# Patient Record
Sex: Female | Born: 2000 | Race: Black or African American | Hispanic: No | Marital: Single | Smoking: Never smoker
Health system: Southern US, Community
[De-identification: ages and names within clinical notes are randomized; demographics above are authoritative.]

## PROBLEM LIST (undated history)

## (undated) DIAGNOSIS — L309 Dermatitis, unspecified: Secondary | ICD-10-CM

## (undated) DIAGNOSIS — A749 Chlamydial infection, unspecified: Secondary | ICD-10-CM

## (undated) DIAGNOSIS — D649 Anemia, unspecified: Secondary | ICD-10-CM

## (undated) DIAGNOSIS — J45909 Unspecified asthma, uncomplicated: Secondary | ICD-10-CM

## (undated) HISTORY — PX: NO PAST SURGERIES: SHX2092

---

## 2019-01-11 NOTE — L&D Delivery Note (Addendum)
Patient is 19 y.o. G2P0010 [redacted]w[redacted]d admitted for IOL for gHTN.   Delivery Note At 5:24 PM a viable female was delivered via Vaginal, Spontaneous (Presentation: Left Occiput Anterior).  APGAR: 8, 9; weight 3694g. Placenta status: Spontaneous, Intact.  Cord: 3 vessel.  Anesthesia: Local lidocaine 20 cc Episiotomy:  none Lacerations: 1st degree;Perineal, repaired as noted below. Bilateral labial, hemostatic without repair. Suture Repair: 2.0 vicryl Est. Blood Loss (mL):  300 mL  Mom to postpartum.  Baby to Couplet care / Skin to Skin.  Upon arrival patient was complete and pushing. She pushed with good maternal effort to deliver a healthy baby boy. Baby delivered without difficulty, was noted to have good tone and place on maternal abdomen for oral suctioning, drying and stimulation. Delayed cord clamping performed. Placenta delivered intact with 3V cord. Vaginal canal and perineum was inspected and found to have a first degree perineal laceration which was repaired with 2.0 vicryl. Bilateral labial lacerations were also present but hemostatic and did not require sutures. Pitocin was started and uterus massaged until bleeding slowed. Counts of sharps, instruments, and lap pads were all correct.   [name], MD PGY-3 8/24/20215:55 PM   GME ATTESTATION:  I saw and evaluated the patient. I agree with the findings and the plan of care as documented in the resident's note.  Alric Seton, MD OB Fellow, [facility], [facility] 09/03/2019 6:43 PM

## 2019-01-16 ENCOUNTER — Inpatient Hospital Stay (HOSPITAL_COMMUNITY): Payer: Medicaid Other

## 2019-01-16 ENCOUNTER — Encounter (HOSPITAL_COMMUNITY): Payer: Self-pay | Admitting: Family Medicine

## 2019-01-16 ENCOUNTER — Inpatient Hospital Stay (HOSPITAL_COMMUNITY)
Admission: AD | Admit: 2019-01-16 | Discharge: 2019-01-16 | Disposition: A | Discharge status: Home / Self Care | Payer: Medicaid Other | Attending: Family Medicine | Admitting: Family Medicine

## 2019-01-16 ENCOUNTER — Other Ambulatory Visit: Payer: Self-pay

## 2019-01-16 DIAGNOSIS — Z833 Family history of diabetes mellitus: Secondary | ICD-10-CM | POA: Insufficient documentation

## 2019-01-16 DIAGNOSIS — J45909 Unspecified asthma, uncomplicated: Secondary | ICD-10-CM | POA: Diagnosis not present

## 2019-01-16 DIAGNOSIS — R109 Unspecified abdominal pain: Secondary | ICD-10-CM

## 2019-01-16 DIAGNOSIS — O99511 Diseases of the respiratory system complicating pregnancy, first trimester: Secondary | ICD-10-CM | POA: Insufficient documentation

## 2019-01-16 DIAGNOSIS — O219 Vomiting of pregnancy, unspecified: Secondary | ICD-10-CM | POA: Diagnosis not present

## 2019-01-16 DIAGNOSIS — O3680X0 Pregnancy with inconclusive fetal viability, not applicable or unspecified: Secondary | ICD-10-CM

## 2019-01-16 DIAGNOSIS — O26891 Other specified pregnancy related conditions, first trimester: Secondary | ICD-10-CM

## 2019-01-16 DIAGNOSIS — O99891 Other specified diseases and conditions complicating pregnancy: Secondary | ICD-10-CM | POA: Insufficient documentation

## 2019-01-16 DIAGNOSIS — Z3A01 Less than 8 weeks gestation of pregnancy: Secondary | ICD-10-CM | POA: Insufficient documentation

## 2019-01-16 HISTORY — DX: Anemia, unspecified: D64.9

## 2019-01-16 HISTORY — DX: Dermatitis, unspecified: L30.9

## 2019-01-16 HISTORY — DX: Chlamydial infection, unspecified: A74.9

## 2019-01-16 HISTORY — DX: Unspecified asthma, uncomplicated: J45.909

## 2019-01-16 LAB — URINALYSIS, ROUTINE W REFLEX MICROSCOPIC
Bilirubin Urine: NEGATIVE
Glucose, UA: NEGATIVE mg/dL
Hgb urine dipstick: NEGATIVE
Ketones, ur: 20 mg/dL — AB
Leukocytes,Ua: NEGATIVE
Nitrite: NEGATIVE
Protein, ur: NEGATIVE mg/dL
Specific Gravity, Urine: 1.012 (ref 1.005–1.030)
pH: 8 (ref 5.0–8.0)

## 2019-01-16 LAB — TYPE AND SCREEN
ABO/RH(D): O POS
Antibody Screen: NEGATIVE

## 2019-01-16 LAB — CBC
HCT: 38 % (ref 36.0–46.0)
Hemoglobin: 12 g/dL (ref 12.0–15.0)
MCH: 24.1 pg — ABNORMAL LOW (ref 26.0–34.0)
MCHC: 31.6 g/dL (ref 30.0–36.0)
MCV: 76.5 fL — ABNORMAL LOW (ref 80.0–100.0)
Platelets: 217 10*3/uL (ref 150–400)
RBC: 4.97 MIL/uL (ref 3.87–5.11)
RDW: 14.8 % (ref 11.5–15.5)
WBC: 11 10*3/uL — ABNORMAL HIGH (ref 4.0–10.5)
nRBC: 0 % (ref 0.0–0.2)

## 2019-01-16 LAB — HCG, QUANTITATIVE, PREGNANCY: hCG, Beta Chain, Quant, S: 170584 m[IU]/mL — ABNORMAL HIGH (ref <5)

## 2019-01-16 LAB — POCT PREGNANCY, URINE: Preg Test, Ur: POSITIVE — AB

## 2019-01-16 LAB — ABO/RH: ABO/RH(D): O POS

## 2019-01-16 MED ORDER — PROMETHAZINE HCL 12.5 MG PO TABS: 12.5000 mg | ORAL_TABLET | Freq: Four times a day (QID) | ORAL | 0 refills | Status: DC | PRN

## 2019-01-16 MED ORDER — ONDANSETRON 4 MG PO TBDP
4.0000 mg | ORAL_TABLET | Freq: Once | ORAL | Status: AC
  Administered 2019-01-16: 4 mg via ORAL
  Filled 2019-01-16: qty 1

## 2019-01-16 NOTE — Discharge Instructions (Signed)
Morning Sickness ° °Morning sickness is when you feel sick to your stomach (nauseous) during pregnancy. You may feel sick to your stomach and throw up (vomit). You may feel sick in the morning, but you can feel this way at any time of day. Some women feel very sick to their stomach and cannot stop throwing up (hyperemesis gravidarum). °Follow these instructions at home: °Medicines °· Take over-the-counter and prescription medicines only as told by your doctor. Do not take any medicines until you talk with your doctor about them first. °· Taking multivitamins before getting pregnant can stop or lessen the harshness of morning sickness. °Eating and drinking °· Eat dry toast or crackers before getting out of bed. °· Eat 5 or 6 small meals a day. °· Eat dry and bland foods like rice and baked potatoes. °· Do not eat greasy, fatty, or spicy foods. °· Have someone cook for you if the smell of food causes you to feel sick or throw up. °· If you feel sick to your stomach after taking prenatal vitamins, take them at night or with a snack. °· Eat protein when you need a snack. Nuts, yogurt, and cheese are good choices. °· Drink fluids throughout the day. °· Try ginger ale made with real ginger, ginger tea made from fresh grated ginger, or ginger candies. °General instructions °· Do not use any products that have nicotine or tobacco in them, such as cigarettes and e-cigarettes. If you need help quitting, ask your doctor. °· Use an air purifier to keep the air in your house free of smells. °· Get lots of fresh air. °· Try to avoid smells that make you feel sick. °· Try: °? Wearing a bracelet that is used for seasickness (acupressure wristband). °? Going to a doctor who puts thin needles into certain body points (acupuncture) to improve how you feel. °Contact a doctor if: °· You need medicine to feel better. °· You feel dizzy or light-headed. °· You are losing weight. °Get help right away if: °· You feel very sick to your  stomach and cannot stop throwing up. °· You pass out (faint). °· You have very bad pain in your belly. °Summary °· Morning sickness is when you feel sick to your stomach (nauseous) during pregnancy. °· You may feel sick in the morning, but you can feel this way at any time of day. °· Making some changes to what you eat may help your symptoms go away. °This information is not intended to replace advice given to you by your health care provider. Make sure you discuss any questions you have with your health care provider. °Document Revised: 12/09/2016 Document Reviewed: 01/28/2016 °Elsevier Patient Education © 2020 Elsevier Inc. ° °

## 2019-01-16 NOTE — MAU Note (Signed)
Keeps getting nauseous during the night.  Since last night has not been able to keep anything down. +HPT, has not been confirmed.  Pain in upper abd, started yesterday.

## 2019-01-16 NOTE — MAU Provider Note (Addendum)
History     CSN: [id number]  Arrival date and time: 01/16/19 1402   First Provider Initiated Contact with Patient 01/16/19 1550      Chief Complaint  Patient presents with   Abdominal Pain   Emesis   Nausea   Possible Pregnancy   HPI Karen Joseph is a 19 yo G2P0010 at [facility] presenting for increased nausea and abdominal pain. Patient states she started having intense nausea with 3 bouts of emesis starting yesterday (1/6). She has not been able to keep any food or liquid down since 8pm last night and had difficulty sleeping due to nausea. She has had diffuse upper abdominal pain for the past two weeks. She has not been to a prenatal appointment, and is waiting for her medicaid information to be updated before scheduling one.   OB History     Gravida  2   Para      Term      Preterm      AB  1   Living  0      SAB      TAB      Ectopic      Multiple      Live Births  0           Past Medical History:  Diagnosis Date   Anemia    Asthma    Chlamydia    Eczema     Past Surgical History:  Procedure Laterality Date   NO PAST SURGERIES      Family History  Problem Relation Age of Onset   Hypertension Mother    Diabetes Mother    Heart disease Mother    ALS Father    Asthma Father     Social History   Tobacco Use   Smoking status: Never Smoker   Smokeless tobacco: Never Used  Substance Use Topics   Alcohol use: Not Currently    Comment: occ   Drug use: Never    Allergies: No Known Allergies  Medications Prior to Admission  Medication Sig Dispense Refill Last Dose   acetaminophen (TYLENOL) 325 MG tablet Take 325 mg by mouth every 6 (six) hours as needed for mild pain.   Past Month at Unknown time   ALBUTEROL IN Inhale into the lungs.   More than a month at Unknown time    Review of Systems  Constitutional: Negative for fatigue and fever.  Respiratory: Negative for cough and shortness of breath.   Cardiovascular: Negative for chest  pain and leg swelling.  Gastrointestinal: Positive for abdominal pain, nausea and vomiting.  Genitourinary: Negative for dysuria and vaginal discharge.  Neurological: Negative for light-headedness.   Physical Exam   Blood pressure 124/77, pulse 78, temperature 98.7 F (37.1 C), temperature source Oral, resp. rate 16, height 5' (1.524 m), weight 52.5 kg, last menstrual period 11/24/2018, SpO2 100 %.  Physical Exam  Constitutional: She is oriented to person, place, and time. She appears well-developed and well-nourished.  HENT:  Head: Normocephalic and atraumatic.  Dry mucous membranes   Cardiovascular: Normal rate and regular rhythm.  Respiratory: Effort normal.  GI: Soft. There is abdominal tenderness in the right upper quadrant, right lower quadrant and left upper quadrant.  Neurological: She is alert and oriented to person, place, and time.  Skin: Skin is warm and dry.    MAU Course  Procedures  MDM -[place] workup started given abdominal pain in first trimester.        CBC, ABO, hCG  quant - (patient d/c before resulting)       TVUS - normal intrauterine pregnancy        UA - normal  -Sublingual Zofran given (patient made aware of controversy regarding use in first trimester)   Assessment and Plan  Karen Joseph is a 19 yo G2P0010 at [facility] presenting for increased nausea and abdominal pain. Nausea is likely normal for first trimester pregnancy, however, required ectopic pregnancy rule out for abdominal tenderness to palpation. Patient's symptoms improved with sublingual Zofran. Ultrasound revealed normal intrauterine pregnancy, therefore patient was discharged with prescription for phenergan.   Nausea  Vomiting  -Phenergan 12.5 mg tablet, take when nauseas  -Return precautions   Normal first trimester pregnancy -Encouraged patient to make first prenatal appointment  -Counseled on marijuana during pregnancy   Lauren R Waskowicz 01/16/2019, 4:32 PM    OB FELLOW  ATTESTATION  I have seen and examined this patient, repeated all portions of the history and physical exam, and agree with above documentation except as noted below.   19 y/o G2P0 who presented with n/v and abdominal pain, mild tenderness to palpation in LLQ and RLQ. Workup notable for living IUP on TVUS ruling out ectopic. Excellent response to ODT Zofran (after discussion of controversy surrounding first trimester use) and appeared well on exam without significant signs of dehydration, able to take PO liquids and food. Strong odor of MJ in room, cautioned against using this for nausea symptoms, given rx for phenergan.   Zack Seal, MD/MPH OB Fellow  01/16/2019, 7:20 PM

## 2019-04-08 ENCOUNTER — Ambulatory Visit (INDEPENDENT_AMBULATORY_CARE_PROVIDER_SITE_OTHER): Payer: Medicaid Other | Admitting: *Deleted

## 2019-04-08 ENCOUNTER — Encounter: Payer: Self-pay | Admitting: General Practice

## 2019-04-08 DIAGNOSIS — Z3480 Encounter for supervision of other normal pregnancy, unspecified trimester: Secondary | ICD-10-CM | POA: Insufficient documentation

## 2019-04-08 MED ORDER — BLOOD PRESSURE MONITOR AUTOMAT DEVI: 1.0000 | Freq: Every day | 0 refills | Status: DC

## 2019-04-08 NOTE — Progress Notes (Signed)
  Virtual Visit via Telephone Note  I connected with [facility] on 04/08/19 at  2:10 PM EDT by telephone and verified that I am speaking with the correct person using two identifiers.  Location: Patient: Karen Joseph MRN: [id number] Provider: Clovis Pu, RN   I discussed the limitations, risks, security and privacy concerns of performing an evaluation and management service by telephone and the availability of in person appointments. I also discussed with the patient that there may be a patient responsible charge related to this service. The patient expressed understanding and agreed to proceed.   History of Present Illness: PRENATAL INTAKE SUMMARY  Karen Joseph presents today New OB Nurse Interview.  OB History    Gravida  2   Para      Term      Preterm      AB  1   Living  0     SAB      TAB      Ectopic      Multiple      Live Births  0          I have reviewed the patient's medical, obstetrical, social, and family histories, medications, and available lab results.  SUBJECTIVE She has no unusual complaints   Observations/Objective: Initial nurse interview for history/labs (New OB)  EDD: 08/31/19 by LMP GA: [redacted]w[redacted]d G2P0010 FHT: non face to face interview  GENERAL APPEARANCE: non face to face interview  Assessment and Plan: Normal pregnancy Prenatal care-[facility] to be completed at next visit with Karen Joseph, CNM 04/25/19 Continue PNV Patient to sign up for Babyscripts Rx for blood pressure monitor sent to [facility] Has weight scale at home  Follow Up Instructions:   I discussed the assessment and treatment plan with the patient. The patient was provided an opportunity to ask questions and all were answered. The patient agreed with the plan and demonstrated an understanding of the instructions.   The patient was advised to call back or seek an in-person evaluation if the symptoms worsen or if the condition fails to  improve as anticipated.  I provided 15 minutes of non-face-to-face time during this encounter.   Clovis Pu, RN

## 2019-04-25 ENCOUNTER — Encounter: Payer: Self-pay | Admitting: General Practice

## 2019-04-25 ENCOUNTER — Other Ambulatory Visit (HOSPITAL_COMMUNITY)
Admission: RE | Admit: 2019-04-25 | Discharge: 2019-04-25 | Disposition: A | Discharge status: Home / Self Care | Payer: Medicaid Other | Source: Ambulatory Visit | Attending: Obstetrics and Gynecology | Admitting: Obstetrics and Gynecology

## 2019-04-25 ENCOUNTER — Other Ambulatory Visit: Payer: Self-pay

## 2019-04-25 ENCOUNTER — Other Ambulatory Visit: Payer: Self-pay | Admitting: *Deleted

## 2019-04-25 ENCOUNTER — Ambulatory Visit (INDEPENDENT_AMBULATORY_CARE_PROVIDER_SITE_OTHER): Payer: Medicaid Other | Admitting: Obstetrics and Gynecology

## 2019-04-25 VITALS — BP 108/69 | HR 101 | Temp 98.1°F | Wt 130.8 lb

## 2019-04-25 DIAGNOSIS — Z8709 Personal history of other diseases of the respiratory system: Secondary | ICD-10-CM

## 2019-04-25 DIAGNOSIS — Z3A21 21 weeks gestation of pregnancy: Secondary | ICD-10-CM

## 2019-04-25 DIAGNOSIS — Z3480 Encounter for supervision of other normal pregnancy, unspecified trimester: Secondary | ICD-10-CM | POA: Diagnosis not present

## 2019-04-25 DIAGNOSIS — O26892 Other specified pregnancy related conditions, second trimester: Secondary | ICD-10-CM

## 2019-04-25 DIAGNOSIS — O26899 Other specified pregnancy related conditions, unspecified trimester: Secondary | ICD-10-CM

## 2019-04-25 DIAGNOSIS — G5600 Carpal tunnel syndrome, unspecified upper limb: Secondary | ICD-10-CM

## 2019-04-25 MED ORDER — ALBUTEROL SULFATE HFA 108 (90 BASE) MCG/ACT IN AERS: 2.0000 | INHALATION_SPRAY | Freq: Four times a day (QID) | RESPIRATORY_TRACT | 2 refills | Status: AC | PRN

## 2019-04-25 MED ORDER — WRIST SPLINT/RIGHT MEDIUM MISC: 1.0000 | Freq: Every day | 0 refills | Status: DC

## 2019-04-25 NOTE — Progress Notes (Addendum)
INITIAL OBSTETRICAL VISIT Patient name: Karen Joseph MRN [id number]  Date of birth: [birth year] Chief Complaint:   Initial Prenatal Visit  History of Present Illness:   Karen Joseph is a 19 y.o. G69P0010 African American female at [redacted]w[redacted]d by LMP with an Estimated Date of Delivery: 08/31/19 being seen today for her initial obstetrical visit.  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 This is an unplanned pregnancy. She and the father of the baby (FOB) "Traviante" live together. She has a support system that consists of the FOB, her mother/father/friends. Today she reports carpal tunnel symptoms and white vaginal discharge. 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 Sh reports she stopped smoking THC the first week in January.  Patient's last menstrual period was 11/24/2018. Last pap n/a. Results were: n/a Review of Systems:   Pertinent items are noted in HPI Denies cramping/contractions, leakage of fluid, vaginal bleeding, abnormal vaginal discharge w/ itching/odor/irritation, headaches, visual changes, shortness of breath, chest pain, abdominal pain, severe nausea/vomiting, or problems with urination or bowel movements unless otherwise stated above.  Pertinent History Reviewed:  Reviewed past medical,surgical, social, obstetrical and family history.  Reviewed problem list, medications and allergies. OB History  Gravida Para Term Preterm AB Living  2       1 0  SAB TAB Ectopic Multiple Live Births          0    # Outcome Date GA Lbr Len/2nd Weight Sex Delivery Anes PTL Lv  2 Current           1 AB            Physical Assessment:   Vitals:   04/25/19 1455  BP: 108/69  Pulse: (!) 101  Temp: 98.1 F (36.7 C)  Weight: 130 lb 12.8 oz (59.3 kg)  Body mass index is 25.55 kg/m.       Physical Examination:  General appearance - well appearing, and in no distress  Mental status - alert, oriented to person, place, and time  Psych:  She has a normal mood and  affect  Skin - warm and dry, normal color, no suspicious lesions noted  Chest - effort normal, all lung fields clear to auscultation bilaterally  Heart - normal rate and regular rhythm  Abdomen - soft, nontender  Extremities:  No swelling or varicosities noted  Pelvic - VULVA: normal appearing vulva with no masses, tenderness or lesions  VAGINA: normal appearing vagina with normal color and discharge, no lesions.   CERVIX: normal appearing cervix without discharge or lesions, no CMT  Thin prep pap is done without HR HPV cotesting Low  No results found for this or any previous visit (from the past 24 hour(s)).  Assessment & Plan:  1) Low-Risk Pregnancy G2P0010 at [redacted]w[redacted]d with an Estimated Date of Delivery: 08/31/19   2) Initial OB visit - Welcomed to practice and introduced self to patient in addition to discussing other advanced practice providers that she may be seeing at this practice - Congratulated patient - Anticipatory guidance on upcoming appointments - Educated on COVID19 and pregnancy and the integration of virtual appointments  - Educated on babyscripts app- patient reports she has not received email, encouraged to look in spam folder and to call office if she still has not received email - patient verbalizes understanding   3) Supervision of other normal pregnancy, antepartum  - Obstetric Panel, Including HIV,  - Hepatitis C Antibody,  - Culture, OB Urine,  - Cytology -  PAP( [place]),  - Cervicovaginal ancillary only( [place]),  - Genetic Screening,  - AFP TETRA,  - Hemoglobin A1c,  - Glucose, random - Information provided on safe meds in pg  - OB MFM 14+ wks complete U/S ordered for 18-20 wks  4) Carpal tunnel syndrome during pregnancy  - Rx for Right Wrist - Plan: Elastic Bandages & Supports (WRIST SPLINT/RIGHT MEDIUM) MISC - Information provided on carpal tunnel syndrome - Explained that with the frequent moving of her wrist with her job, it is not unusual to  have carpal tunnel syndrome in pregnancy     Meds:  Meds ordered this encounter  Medications  . Elastic Bandages & Supports (WRIST SPLINT/RIGHT MEDIUM) MISC    Sig: 1 each by Does not apply route daily.    Dispense:  1 each    Refill:  0    Order Specific Question:   Supervising Provider    Answer:   Donnamae Jude [[id number]]    Initial labs obtained Continue prenatal vitamins Reviewed n/v relief measures and warning s/s to report Reviewed recommended weight gain based on pre-gravid BMI Encouraged well-balanced diet Genetic Screening discussed: ordered Cystic fibrosis, SMA, Fragile X screening discussed ordered The nature of [place] with multiple MDs and other Advanced Practice Providers was explained to patient; also emphasized that residents, students are part of our team.  Discussed optimized OB schedule and video visits. Advised can have an in-office visit whenever she feels she needs to be seen.  Does own BP cuff. Explained to patient that BP will be mailed to her house. Check BP weekly, let us know if >140/90. Advised to call during normal business hours and there is an after-hours nurse line available.    Follow-up: Return in about 3 weeks (around 05/16/2019) for Return OB - My Chart video.   Orders Placed This Encounter  Procedures  . Culture, OB Urine  . Obstetric Panel, Including HIV  . Hepatitis C Antibody  . Genetic Screening  . AFP TETRA  . Hemoglobin A1c  . Glucose, random    [employer] MSN, [place] 04/25/2019

## 2019-04-25 NOTE — Patient Instructions (Addendum)
Carpal Tunnel Syndrome  Carpal tunnel syndrome is a condition that causes pain in your hand and arm. The carpal tunnel is a narrow area located on the palm side of your wrist. Repeated wrist motion or certain diseases may cause swelling within the tunnel. This swelling pinches the main nerve in the wrist (median nerve). What are the causes? This condition may be caused by:  Repeated wrist motions.  Wrist injuries.  Arthritis.  A cyst or tumor in the carpal tunnel.  Fluid buildup during pregnancy. Sometimes the cause of this condition is not known. What increases the risk? The following factors may make you more likely to develop this condition:  Having a job, such as being a butcher or a cashier, that requires you to repeatedly move your wrist in the same motion.  Being a woman.  Having certain conditions, such as: ? Diabetes. ? Obesity. ? An underactive thyroid (hypothyroidism). ? Kidney failure. What are the signs or symptoms? Symptoms of this condition include:  A tingling feeling in your fingers, especially in your thumb, index, and middle fingers.  Tingling or numbness in your hand.  An aching feeling in your entire arm, especially when your wrist and elbow are bent for a long time.  Wrist pain that goes up your arm to your shoulder.  Pain that goes down into your palm or fingers.  A weak feeling in your hands. You may have trouble grabbing and holding items. Your symptoms may feel worse during the night. How is this diagnosed? This condition is diagnosed with a medical history and physical exam. You may also have tests, including:  Electromyogram (EMG). This test measures electrical signals sent by your nerves into the muscles.  Nerve conduction study. This test measures how well electrical signals pass through your nerves.  Imaging tests, such as X-rays, ultrasound, and MRI. These tests check for possible causes of your condition. How is this treated? This  condition may be treated with:  Lifestyle changes. It is important to stop or change the activity that caused your condition.  Doing exercise and activities to strengthen your muscles and bones (physical therapy).  Learning how to use your hand again after diagnosis (occupational therapy).  Medicines for pain and inflammation. This may include medicine that is injected into your wrist.  A wrist splint.  Surgery. Follow these instructions at home: If you have a splint:  Wear the splint as told by your health care provider. Remove it only as told by your health care provider.  Loosen the splint if your fingers tingle, become numb, or turn cold and blue.  Keep the splint clean.  If the splint is not waterproof: ? Do not let it get wet. ? Cover it with a watertight covering when you take a bath or shower. Managing pain, stiffness, and swelling   If directed, put ice on the painful area: ? If you have a removable splint, remove it as told by your health care provider. ? Put ice in a plastic bag. ? Place a towel between your skin and the bag. ? Leave the ice on for 20 minutes, 2-3 times per day. General instructions  Take over-the-counter and prescription medicines only as told by your health care provider.  Rest your wrist from any activity that may be causing your pain. If your condition is work related, talk with your employer about changes that can be made, such as getting a wrist pad to use while typing.  Do any exercises as told   by your health care provider, physical therapist, or occupational therapist.  Keep all follow-up visits as told by your health care provider. This is important. Contact a health care provider if:  You have new symptoms.  Your pain is not controlled with medicines.  Your symptoms get worse. Get help right away if:  You have severe numbness or tingling in your wrist or hand. Summary  Carpal tunnel syndrome is a condition that causes pain in  your hand and arm.  It is usually caused by repeated wrist motions.  Lifestyle changes and medicines are used to treat carpal tunnel syndrome. Surgery may be recommended.  Follow your health care provider's instructions about wearing a splint, resting from activity, keeping follow-up visits, and calling for help. This information is not intended to replace advice given to you by your health care provider. Make sure you discuss any questions you have with your health care provider. Document Revised: 05/05/2017 Document Reviewed: 05/05/2017 Elsevier Patient Education  2020 [employer].  Safe Medications in Pregnancy   Acne: Benzoyl Peroxide Salicylic Acid  Backache/Headache: Tylenol: 2 regular strength every 4 hours OR              2 Extra strength every 6 hours  Colds/Coughs/Allergies: Benadryl (alcohol free) 25 mg every 6 hours as needed Breath right strips Claritin Cepacol throat lozenges Chloraseptic throat spray Cold-Eeze- up to three times per day Cough drops, alcohol free Flonase (by prescription only) Guaifenesin Mucinex Robitussin DM (plain only, alcohol free) Saline nasal spray/drops Sudafed (pseudoephedrine) & Actifed ** use only after [redacted] weeks gestation and if you do not have high blood pressure Tylenol Vicks Vaporub Zinc lozenges Zyrtec   Constipation: Colace Ducolax suppositories Fleet enema Glycerin suppositories Metamucil Milk of magnesia Miralax Senokot Smooth move tea  Diarrhea: Kaopectate Imodium A-D  *NO pepto Bismol  Hemorrhoids: Anusol Anusol HC Preparation H Tucks  Indigestion: Tums Maalox Mylanta Zantac  Pepcid  Insomnia: Benadryl (alcohol free) 25mg  every 6 hours as needed Tylenol PM Unisom, no Gelcaps  Leg Cramps: Tums MagGel  Nausea/Vomiting:  Bonine Dramamine Emetrol Ginger extract Sea bands Meclizine  Nausea medication to take during pregnancy:  Unisom (doxylamine succinate 25 mg tablets) Take one  tablet daily at bedtime. If symptoms are not adequately controlled, the dose can be increased to a maximum recommended dose of two tablets daily (1/2 tablet in the morning, 1/2 tablet mid-afternoon and one at bedtime). Vitamin B6 100mg  tablets. Take one tablet twice a day (up to 200 mg per day).  Skin Rashes: Aveeno products Benadryl cream or 25mg  every 6 hours as needed Calamine Lotion 1% cortisone cream  Yeast infection: Gyne-lotrimin 7 Monistat 7   **If taking multiple medications, please check labels to avoid duplicating the same active ingredients **take medication as directed on the label ** Do not exceed 4000 mg of tylenol in 24 hours **Do not take medications that contain aspirin or ibuprofen

## 2019-04-26 ENCOUNTER — Encounter: Payer: Self-pay | Admitting: General Practice

## 2019-04-26 LAB — CERVICOVAGINAL ANCILLARY ONLY
Bacterial Vaginitis (gardnerella): NEGATIVE
Candida Glabrata: NEGATIVE
Candida Vaginitis: POSITIVE — AB
Chlamydia: NEGATIVE
Comment: NEGATIVE
Comment: NEGATIVE
Comment: NEGATIVE
Comment: NEGATIVE
Comment: NEGATIVE
Comment: NORMAL
Neisseria Gonorrhea: NEGATIVE
Trichomonas: NEGATIVE

## 2019-04-26 LAB — HEPATITIS C ANTIBODY: Hep C Virus Ab: 0.1 s/co ratio (ref 0.0–0.9)

## 2019-04-26 LAB — GLUCOSE, RANDOM: Glucose: 84 mg/dL (ref 65–99)

## 2019-04-26 LAB — HEMOGLOBIN A1C
Est. average glucose Bld gHb Est-mCnc: 97 mg/dL
Hgb A1c MFr Bld: 5 % (ref 4.8–5.6)

## 2019-04-27 LAB — OBSTETRIC PANEL, INCLUDING HIV
Antibody Screen: NEGATIVE
Basophils Absolute: 0 10*3/uL (ref 0.0–0.2)
Basos: 0 %
EOS (ABSOLUTE): 0.4 10*3/uL (ref 0.0–0.4)
Eos: 3 %
HIV Screen 4th Generation wRfx: NONREACTIVE
Hematocrit: 29 % — ABNORMAL LOW (ref 34.0–46.6)
Hemoglobin: 9.1 g/dL — ABNORMAL LOW (ref 11.1–15.9)
Hepatitis B Surface Ag: NEGATIVE
Immature Grans (Abs): 0 10*3/uL (ref 0.0–0.1)
Immature Granulocytes: 0 %
Lymphocytes Absolute: 1.7 10*3/uL (ref 0.7–3.1)
Lymphs: 15 %
MCH: 24.7 pg — ABNORMAL LOW (ref 26.6–33.0)
MCHC: 31.4 g/dL — ABNORMAL LOW (ref 31.5–35.7)
MCV: 79 fL (ref 79–97)
Monocytes Absolute: 0.9 10*3/uL (ref 0.1–0.9)
Monocytes: 7 %
Neutrophils Absolute: 8.8 10*3/uL — ABNORMAL HIGH (ref 1.4–7.0)
Neutrophils: 75 %
Platelets: 253 10*3/uL (ref 150–450)
RBC: 3.68 x10E6/uL — ABNORMAL LOW (ref 3.77–5.28)
RDW: 13.9 % (ref 11.7–15.4)
RPR Ser Ql: NONREACTIVE
Rh Factor: POSITIVE
Rubella Antibodies, IGG: 1.5 index (ref >0.99)
WBC: 11.8 10*3/uL — ABNORMAL HIGH (ref 3.4–10.8)

## 2019-04-27 LAB — AFP TETRA
DIA Mom Value: 1.13
DIA Value (EIA): 259.61 pg/mL
DSR (By Age)    1 IN: 1137
DSR (Second Trimester) 1 IN: 10000
Gestational Age: 21 WEEKS
MSAFP Mom: 1.14
MSAFP: 91.9 ng/mL
MSHCG Mom: 0.42
MSHCG: 10765 m[IU]/mL
Maternal Age At EDD: 21.6 yr
Osb Risk: 10000
T18 (By Age): 1:4429 {titer}
Test Results:: NEGATIVE
Weight: 130 [lb_av]
uE3 Mom: 1.01
uE3 Value: 2.92 ng/mL

## 2019-04-27 LAB — URINE CULTURE, OB REFLEX

## 2019-04-27 LAB — CULTURE, OB URINE

## 2019-04-29 LAB — CYTOLOGY - PAP: Diagnosis: NEGATIVE

## 2019-05-06 ENCOUNTER — Encounter: Payer: Self-pay | Admitting: General Practice

## 2019-05-09 ENCOUNTER — Encounter: Payer: Self-pay | Admitting: General Practice

## 2019-05-16 ENCOUNTER — Telehealth (INDEPENDENT_AMBULATORY_CARE_PROVIDER_SITE_OTHER): Payer: Medicaid Other | Admitting: Obstetrics and Gynecology

## 2019-05-16 DIAGNOSIS — Z3A26 26 weeks gestation of pregnancy: Secondary | ICD-10-CM

## 2019-05-16 DIAGNOSIS — Z3480 Encounter for supervision of other normal pregnancy, unspecified trimester: Secondary | ICD-10-CM

## 2019-05-16 NOTE — Patient Instructions (Signed)
Considering Waterbirth? Guide for patients at [facility]  Why consider waterbirth?  . Gentle birth for babies . Less pain medicine used in labor . May allow for passive descent/less pushing . May reduce perineal tears  . More mobility and instinctive maternal position changes . Increased maternal relaxation . Reduced blood pressure in labor  Is waterbirth safe? What are the risks of infection, drowning or other complications?  . Infection: o Very low risk (3.7 % for tub vs 4.8% for bed) o 7 in 8000 waterbirths with documented infection o Poorly cleaned equipment most common cause o Slightly lower group B strep transmission rate  . Drowning o Maternal:  - Very low risk   - Related to seizures or fainting o Newborn:  - Very low risk. No evidence of increased risk of respiratory problems in multiple large studies - Physiological protection from breathing under water - Avoid underwater birth if there are any fetal complications - Once baby's head is out of the water, keep it out.  . Birth complication o Some reports of cord trauma, but risk decreased by bringing baby to surface gradually o No evidence of increased risk of shoulder dystocia. Mothers can usually change positions faster in water than in a bed, possibly aiding the maneuvers to free the shoulder.   You must attend a [facility]en Dolin class at [employer] by calling [phone] or online at [email or web address] for virtual class  Bring [place] the certificate from the class to your prenatal appointment  Meet with a midwife at 36 weeks to see if you can still plan a waterbirth and to sign the consent.   Things that would prevent you from having a waterbirth:  (+) COVID-19 infection  Premature, <37wks  Previous cesarean birth  Presence of thick meconium-stained fluid  Multiple gestation (Twins, triplets, etc.)  Uncontrolled diabetes or gestational diabetes  requiring medication  Hypertension requiring medication or diagnosis of pre-eclampsia  Heavy vaginal bleeding  Non-reassuring fetal heart rate  Active infection (MRSA, etc.). Group B Strep is NOT a contraindication for waterbirth.  If your labor has to be induced and induction method requires continuous monitoring of the baby's heart rate  Other risks/issues identified by your obstetrical provider  Please remember that birth is unpredictable. Under certain unforeseeable circumstances your provider may advise against giving birth in the tub. These decisions will be made on a case-by-case basis and with the safety of you and your baby as our highest priority.

## 2019-05-21 ENCOUNTER — Other Ambulatory Visit: Payer: Self-pay

## 2019-05-21 ENCOUNTER — Encounter: Payer: Self-pay | Admitting: General Practice

## 2019-05-21 ENCOUNTER — Ambulatory Visit (HOSPITAL_COMMUNITY): Payer: Medicaid Other | Attending: Obstetrics and Gynecology

## 2019-05-21 DIAGNOSIS — Z3A25 25 weeks gestation of pregnancy: Secondary | ICD-10-CM

## 2019-05-21 DIAGNOSIS — Z363 Encounter for antenatal screening for malformations: Secondary | ICD-10-CM | POA: Diagnosis not present

## 2019-05-21 DIAGNOSIS — Z148 Genetic carrier of other disease: Secondary | ICD-10-CM | POA: Diagnosis not present

## 2019-05-21 DIAGNOSIS — Z3480 Encounter for supervision of other normal pregnancy, unspecified trimester: Secondary | ICD-10-CM | POA: Insufficient documentation

## 2019-05-26 ENCOUNTER — Encounter: Payer: Self-pay | Admitting: Obstetrics and Gynecology

## 2019-05-26 NOTE — Progress Notes (Signed)
   MY CHART VIDEO VIRTUAL OBSTETRICS VISIT ENCOUNTER NOTE  I connected with Karen Joseph on 05/16/19 at  3:50 PM EDT by My Chart video at home and verified that I am speaking with the correct person using two identifiers.   I discussed the limitations, risks, security and privacy concerns of performing an evaluation and management service by My Chart video and the availability of in person appointments. I also discussed with the patient that there may be a patient responsible charge related to this service. The patient expressed understanding and agreed to proceed.  Subjective:  Karen Joseph is a 19 y.o. G2P0010 at [redacted]w[redacted]d being followed for ongoing prenatal care.  She is currently monitored for the following issues for this low-risk pregnancy and has Supervision of other normal pregnancy, antepartum on their problem list.  Patient reports no complaints. She expresses a desire to have a waterbirth, but not sure what it entails. Reports fetal movement. Denies any contractions, bleeding or leaking of fluid.   The following portions of the patient's history were reviewed and updated as appropriate: allergies, current medications, past family history, past medical history, past social history, past surgical history and problem list.   Objective:   General:  Alert, oriented and cooperative.   Mental Status: Normal mood and affect perceived. Normal judgment and thought content.  Rest of physical exam deferred due to type of encounter  BP 123/82   Pulse (!) 102   Wt 137 lb (62.1 kg)   LMP 11/24/2018   BMI 26.76 kg/m  **Done by patient's own at home BP cuff and scale  Assessment and Plan:  Pregnancy: G2P0010 at [redacted]w[redacted]d  1. Supervision of other normal pregnancy, antepartum - Anticipatory guidance given for upcoming 2 hr GTT  - Advised to have nothing to eat after midnight the night before her appr  Preterm labor symptoms and general obstetric precautions including but not limited to vaginal  bleeding, contractions, leaking of fluid and fetal movement were reviewed in detail with the patient.  I discussed the assessment and treatment plan with the patient. The patient was provided an opportunity to ask questions and all were answered. The patient agreed with the plan and demonstrated an understanding of the instructions. The patient was advised to call back or seek an in-person office evaluation/go to MAU at [facility] for any urgent or concerning symptoms. Please refer to After Visit Summary for other counseling recommendations.   I provided 5 minutes of non-face-to-face time during this encounter. There was 5 minutes of chart review time spent prior to this encounter. Total time spent = 10 minutes.  Return in about 4 weeks (around 06/13/2019) for Return OB 2hr GTT.  Future Appointments  Date Time Provider Department Center  06/12/2019  8:10 AM Burleson, Brand Males, NP [facility] None    Raelyn Mora, CNM [facility], [facility]

## 2019-06-12 ENCOUNTER — Encounter: Payer: Self-pay | Admitting: General Practice

## 2019-06-12 ENCOUNTER — Other Ambulatory Visit: Payer: Self-pay

## 2019-06-12 ENCOUNTER — Encounter: Payer: Self-pay | Admitting: Nurse Practitioner

## 2019-06-12 ENCOUNTER — Ambulatory Visit (INDEPENDENT_AMBULATORY_CARE_PROVIDER_SITE_OTHER): Payer: Medicaid Other | Admitting: Nurse Practitioner

## 2019-06-12 VITALS — BP 114/72 | HR 90 | Temp 97.5°F | Wt 140.2 lb

## 2019-06-12 DIAGNOSIS — Z3A28 28 weeks gestation of pregnancy: Secondary | ICD-10-CM

## 2019-06-12 DIAGNOSIS — Z3483 Encounter for supervision of other normal pregnancy, third trimester: Secondary | ICD-10-CM

## 2019-06-12 DIAGNOSIS — Z3480 Encounter for supervision of other normal pregnancy, unspecified trimester: Secondary | ICD-10-CM

## 2019-06-12 DIAGNOSIS — G5603 Carpal tunnel syndrome, bilateral upper limbs: Secondary | ICD-10-CM

## 2019-06-12 DIAGNOSIS — Z23 Encounter for immunization: Secondary | ICD-10-CM | POA: Diagnosis not present

## 2019-06-12 NOTE — Progress Notes (Signed)
    Subjective:  Karen Joseph is a 19 y.o. G2P0010 at [redacted]w[redacted]d being seen today for ongoing prenatal care.  She is currently monitored for the following issues for this low-risk pregnancy and has Supervision of other normal pregnancy, antepartum and Carpal tunnel syndrome on both sides on their problem list.  Patient reports having some tingling in both hands daily.  Has a wrist splint she is using at night on her right hand.  Offered to prescribe another splint, but not interested just now as it is not as severe in the left hand.  Contractions: Not present. Vag. Bleeding: None.  Movement: Present. Denies leaking of fluid.   The following portions of the patient's history were reviewed and updated as appropriate: allergies, current medications, past family history, past medical history, past social history, past surgical history and problem list. Problem list updated.  Objective:   Vitals:   06/12/19 0814  BP: 114/72  Pulse: 90  Temp: (!) 97.5 F (36.4 C)  Weight: 140 lb 3.2 oz (63.6 kg)    Fetal Status: Fetal Heart Rate (bpm): 141 Fundal Height: 31 cm Movement: Present     General:  Alert, oriented and cooperative. Patient is in no acute distress.  Skin: Skin is warm and dry. No rash noted.   Cardiovascular: Normal heart rate noted  Respiratory: Normal respiratory effort, no problems with respiration noted  Abdomen: Soft, gravid, appropriate for gestational age. Pain/Pressure: Absent     Pelvic:  Cervical exam deferred        Extremities: Normal range of motion.  Edema: None  Mental Status: Normal mood and affect. Normal behavior. Normal judgment and thought content.   Urinalysis:      Assessment and Plan:  Pregnancy: G2P0010 at [redacted]w[redacted]d  1. Supervision of other normal pregnancy, antepartum Reviewed putting BP into Babyscripts once a week. Reviewed signing up for childbirth and breastfeeding clsses. Reviewed selecting a pediatrician. 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 Has not yet had Covid vaccine - advised to notify office if she gets the Covid vaccine  - Glucose Tolerance, 2 Hours w/1 Hour - HIV Antibody (routine testing w rflx) - RPR - CBC - Tdap vaccine greater than or equal to 7yo IM  2. Need for tetanus, diphtheria, and acellular pertussis (Tdap) vaccine in patient of adolescent age or older  - Tdap vaccine greater than or equal to 7yo IM  Preterm labor symptoms and general obstetric precautions including but not limited to vaginal bleeding, contractions, leaking of fluid and fetal movement were reviewed in detail with the patient. Please refer to After Visit Summary for other counseling recommendations.  Return in about 2 weeks (around 06/26/2019) for virtual ROB.  Nolene Bernheim, RN, MSN, NP-BC Nurse Practitioner, [facility] [employer], [facility] 06/12/2019 8:59 AM

## 2019-06-13 ENCOUNTER — Encounter: Payer: Self-pay | Admitting: Nurse Practitioner

## 2019-06-13 ENCOUNTER — Other Ambulatory Visit: Payer: Self-pay | Admitting: *Deleted

## 2019-06-13 DIAGNOSIS — O99019 Anemia complicating pregnancy, unspecified trimester: Secondary | ICD-10-CM | POA: Insufficient documentation

## 2019-06-13 LAB — GLUCOSE TOLERANCE, 2 HOURS W/ 1HR
Glucose, 1 hour: 129 mg/dL (ref 65–179)
Glucose, 2 hour: 74 mg/dL (ref 65–152)
Glucose, Fasting: 88 mg/dL (ref 65–91)

## 2019-06-13 LAB — CBC
Hematocrit: 26.4 % — ABNORMAL LOW (ref 34.0–46.6)
Hemoglobin: 8 g/dL — ABNORMAL LOW (ref 11.1–15.9)
MCH: 23.2 pg — ABNORMAL LOW (ref 26.6–33.0)
MCHC: 30.3 g/dL — ABNORMAL LOW (ref 31.5–35.7)
MCV: 77 fL — ABNORMAL LOW (ref 79–97)
Platelets: 218 10*3/uL (ref 150–450)
RBC: 3.45 x10E6/uL — ABNORMAL LOW (ref 3.77–5.28)
RDW: 14.6 % (ref 11.7–15.4)
WBC: 9 10*3/uL (ref 3.4–10.8)

## 2019-06-13 LAB — HIV ANTIBODY (ROUTINE TESTING W REFLEX): HIV Screen 4th Generation wRfx: NONREACTIVE

## 2019-06-13 LAB — RPR: RPR Ser Ql: NONREACTIVE

## 2019-06-13 MED ORDER — ASCORBIC ACID 500 MG PO TABS: 500.0000 mg | ORAL_TABLET | ORAL | 2 refills | Status: AC

## 2019-06-13 MED ORDER — IRON 325 (65 FE) MG PO TABS: 1.0000 | ORAL_TABLET | ORAL | 2 refills | Status: AC

## 2019-06-20 ENCOUNTER — Other Ambulatory Visit: Payer: Self-pay

## 2019-06-20 ENCOUNTER — Ambulatory Visit (HOSPITAL_COMMUNITY)
Admission: RE | Admit: 2019-06-20 | Discharge: 2019-06-20 | Disposition: A | Discharge status: Home / Self Care | Payer: Medicaid Other | Source: Ambulatory Visit | Attending: Family Medicine | Admitting: Family Medicine

## 2019-06-20 DIAGNOSIS — D509 Iron deficiency anemia, unspecified: Secondary | ICD-10-CM | POA: Diagnosis present

## 2019-06-20 MED ORDER — SODIUM CHLORIDE 0.9 % IV SOLN
510.0000 mg | INTRAVENOUS | Status: DC
  Administered 2019-06-20: 510 mg via INTRAVENOUS
  Filled 2019-06-20: qty 17

## 2019-06-20 NOTE — Discharge Instructions (Signed)

## 2019-06-26 ENCOUNTER — Encounter: Payer: Self-pay | Admitting: Obstetrics and Gynecology

## 2019-06-26 ENCOUNTER — Telehealth (INDEPENDENT_AMBULATORY_CARE_PROVIDER_SITE_OTHER): Payer: Medicaid Other | Admitting: Obstetrics and Gynecology

## 2019-06-26 DIAGNOSIS — Z3480 Encounter for supervision of other normal pregnancy, unspecified trimester: Secondary | ICD-10-CM

## 2019-06-26 DIAGNOSIS — Z3483 Encounter for supervision of other normal pregnancy, third trimester: Secondary | ICD-10-CM | POA: Diagnosis not present

## 2019-06-26 NOTE — Progress Notes (Signed)
   MY CHART VIDEO VIRTUAL OBSTETRICS VISIT ENCOUNTER NOTE  I connected with Karen Joseph on 06/26/19 at 10:30 AM EDT by My Chart video at home and verified that I am speaking with the correct person using two identifiers. Provider located at [facility] [employer] at [place].   I discussed the limitations, risks, security and privacy concerns of performing an evaluation and management service by My Chart video and the availability of in person appointments. I also discussed with the patient that there may be a patient responsible charge related to this service. The patient expressed understanding and agreed to proceed.  Subjective:  Karen Joseph is a 19 y.o. G2P0010 at [redacted]w[redacted]d being followed for ongoing prenatal care.  She is currently monitored for the following issues for this low-risk pregnancy and has Supervision of other normal pregnancy, antepartum; Carpal tunnel syndrome on both sides; and Anemia in pregnancy, unspecified trimester on their problem list.  Patient reports feet swelling. Reports fetal movement. Denies any contractions, bleeding or leaking of fluid.   The following portions of the patient's history were reviewed and updated as appropriate: allergies, current medications, past family history, past medical history, past social history, past surgical history and problem list.   Objective:   General:  Alert, oriented and cooperative.   Mental Status: Normal mood and affect perceived. Normal judgment and thought content.  Rest of physical exam deferred due to type of encounter  BP 115/77   Pulse 97   Wt 140 lb (63.5 kg)   LMP 11/24/2018   BMI 27.34 kg/m  **Done by patient's own at home BP cuff and scale  Assessment and Plan:  Pregnancy: G2P0010 at [redacted]w[redacted]d  1. 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  I discussed the assessment and treatment plan with the patient. The patient was provided an opportunity to ask questions and all were answered. The patient agreed with the plan and demonstrated an understanding of the instructions. The patient was advised to call back or seek an in-person office evaluation/go to MAU at [facility] for any urgent or concerning symptoms. Please refer to After Visit Summary for other counseling recommendations.   I provided 5 minutes of non-face-to-face time during this encounter. There was 5 minutes of chart review time spent prior to this encounter. Total time spent = 10 minutes.  Return in about 4 weeks (around 07/24/2019) for Return OB - My Chart video.  Future Appointments  Date Time Provider Department Center  06/27/2019 10:00 AM [facility] ROOM 8 [facility] None    Raelyn Mora, CNM [facility], [facility]

## 2019-06-27 ENCOUNTER — Ambulatory Visit (HOSPITAL_COMMUNITY)
Admission: RE | Admit: 2019-06-27 | Discharge: 2019-06-27 | Disposition: A | Discharge status: Home / Self Care | Payer: Medicaid Other | Source: Ambulatory Visit | Attending: Family Medicine | Admitting: Family Medicine

## 2019-06-27 ENCOUNTER — Encounter (HOSPITAL_COMMUNITY): Payer: Medicaid Other

## 2019-06-27 ENCOUNTER — Other Ambulatory Visit: Payer: Self-pay

## 2019-06-27 DIAGNOSIS — D509 Iron deficiency anemia, unspecified: Secondary | ICD-10-CM | POA: Diagnosis present

## 2019-06-27 DIAGNOSIS — O99019 Anemia complicating pregnancy, unspecified trimester: Secondary | ICD-10-CM | POA: Diagnosis present

## 2019-06-27 MED ORDER — SODIUM CHLORIDE 0.9 % IV SOLN
510.0000 mg | INTRAVENOUS | Status: DC
  Administered 2019-06-27: 510 mg via INTRAVENOUS
  Filled 2019-06-27: qty 17

## 2019-07-19 NOTE — Progress Notes (Signed)
CBC with Hgb of 8.0 with MCV of 77 c/w iron deficiency anemia. For IV iron infusion.

## 2019-07-19 NOTE — Addendum Note (Signed)
Encounter addended by: Reva Bores, MD on: 07/19/2019 7:22 PM  Actions taken: Clinical Note Signed

## 2019-07-25 ENCOUNTER — Telehealth (INDEPENDENT_AMBULATORY_CARE_PROVIDER_SITE_OTHER): Payer: Medicaid Other | Admitting: Advanced Practice Midwife

## 2019-07-25 VITALS — BP 115/75 | HR 100 | Wt 145.0 lb

## 2019-07-25 DIAGNOSIS — R197 Diarrhea, unspecified: Secondary | ICD-10-CM

## 2019-07-25 DIAGNOSIS — O99891 Other specified diseases and conditions complicating pregnancy: Secondary | ICD-10-CM

## 2019-07-25 DIAGNOSIS — Z3480 Encounter for supervision of other normal pregnancy, unspecified trimester: Secondary | ICD-10-CM

## 2019-07-25 DIAGNOSIS — Z3A34 34 weeks gestation of pregnancy: Secondary | ICD-10-CM

## 2019-07-25 NOTE — Progress Notes (Signed)
° °  OBSTETRICS PRENATAL VIRTUAL VISIT ENCOUNTER NOTE  Provider location: [facility] [place]   I connected with [facility] on 07/25/19 at 10:50 AM EDT by MyChart Video Encounter at home and verified that I am speaking with the correct person using two identifiers.   I discussed the limitations, risks, security and privacy concerns of performing an evaluation and management service virtually and the availability of in person appointments. I also discussed with the patient that there may be a patient responsible charge related to this service. The patient expressed understanding and agreed to proceed. Subjective:  Karen Joseph is a 19 y.o. G2P0010 at [redacted]w[redacted]d being seen today for ongoing prenatal care.  She is currently monitored for the following issues for this low-risk pregnancy and has Supervision of other normal pregnancy, antepartum; Carpal tunnel syndrome on both sides; and Anemia in pregnancy, unspecified trimester on their problem list.  Patient reports diarrhea, now resolved.  Contractions: Not present. Vag. Bleeding: None.  Movement: Present. Denies any leaking of fluid.   The following portions of the patient's history were reviewed and updated as appropriate: allergies, current medications, past family history, past medical history, past social history, past surgical history and problem list.   Objective:   Vitals:   07/25/19 1050  BP: 115/75  Pulse: 100  Weight: 145 lb (65.8 kg)    Fetal Status:     Movement: Present     General:  Alert, oriented and cooperative. Patient is in no acute distress.  Respiratory: Normal respiratory effort, no problems with respiration noted  Mental Status: Normal mood and affect. Normal behavior. Normal judgment and thought content.  Rest of physical exam deferred due to type of encounter  Imaging: No results found.  Assessment and Plan:  Pregnancy: G2P0010 at [redacted]w[redacted]d 1. Supervision of other normal pregnancy,  antepartum - Routine care - Preemptive teaching in event of GBS + result, newborn sepsis concerns, impact on care  2. Diarrhea, unspecified type - Likely related to contaminated food - OTC Imodium safe in pregnancy, take as written if recurrence of diarrhea  Preterm labor symptoms and general obstetric precautions including but not limited to vaginal bleeding, contractions, leaking of fluid and fetal movement were reviewed in detail with the patient. I discussed the assessment and treatment plan with the patient. The patient was provided an opportunity to ask questions and all were answered. The patient agreed with the plan and demonstrated an understanding of the instructions. The patient was advised to call back or seek an in-person office evaluation/go to MAU at [facility] for any urgent or concerning symptoms. Please refer to After Visit Summary for other counseling recommendations.   I provided eight minutes of face-to-face time during this encounter.  Return in about 2 weeks (around 08/08/2019).  Future Appointments  Date Time Provider Department Center  08/07/2019  8:50 AM Judeth Horn, NP [facility] None    Calvert Cantor, CNM [facility], [facility]

## 2019-07-25 NOTE — Patient Instructions (Signed)

## 2019-08-07 ENCOUNTER — Other Ambulatory Visit: Payer: Self-pay

## 2019-08-07 ENCOUNTER — Ambulatory Visit (INDEPENDENT_AMBULATORY_CARE_PROVIDER_SITE_OTHER): Payer: Medicaid Other | Admitting: Student

## 2019-08-07 ENCOUNTER — Other Ambulatory Visit (HOSPITAL_COMMUNITY)
Admission: RE | Admit: 2019-08-07 | Discharge: 2019-08-07 | Disposition: A | Discharge status: Home / Self Care | Payer: Medicaid Other | Source: Ambulatory Visit | Attending: Student | Admitting: Student

## 2019-08-07 VITALS — BP 115/74 | HR 87 | Temp 97.7°F | Wt 153.4 lb

## 2019-08-07 DIAGNOSIS — Z3480 Encounter for supervision of other normal pregnancy, unspecified trimester: Secondary | ICD-10-CM

## 2019-08-07 DIAGNOSIS — Z3A36 36 weeks gestation of pregnancy: Secondary | ICD-10-CM

## 2019-08-07 DIAGNOSIS — O99013 Anemia complicating pregnancy, third trimester: Secondary | ICD-10-CM

## 2019-08-07 NOTE — Progress Notes (Signed)
   PRENATAL VISIT NOTE  Subjective:  Karen Joseph is a 19 y.o. G2P0010 at [redacted]w[redacted]d being seen today for ongoing prenatal care.  She is currently monitored for the following issues for this low-risk pregnancy and has Supervision of other normal pregnancy, antepartum; Carpal tunnel syndrome on both sides; and Anemia in pregnancy, unspecified trimester on their problem list.  Patient reports carpal tunnel symptoms.  Contractions: Not present. Vag. Bleeding: None.  Movement: Present. Denies leaking of fluid.   The following portions of the patient's history were reviewed and updated as appropriate: allergies, current medications, past family history, past medical history, past social history, past surgical history and problem list.   Objective:   Vitals:   08/07/19 0925  BP: 115/74  Pulse: 87  Temp: 97.7 F (36.5 C)  Weight: 153 lb 6.4 oz (69.6 kg)    Fetal Status: Fetal Heart Rate (bpm): 144 Fundal Height: 37 cm Movement: Present  Presentation: Vertex  General:  Alert, oriented and cooperative. Patient is in no acute distress.  Skin: Skin is warm and dry. No rash noted.   Cardiovascular: Normal heart rate noted  Respiratory: Normal respiratory effort, no problems with respiration noted  Abdomen: Soft, gravid, appropriate for gestational age.  Pain/Pressure: Absent     Pelvic: Cervical exam deferred        Extremities: Normal range of motion.  Edema: Other (Comment)  Mental Status: Normal mood and affect. Normal behavior. Normal judgment and thought content.   Assessment and Plan:  Pregnancy: G2P0010 at [redacted]w[redacted]d 1. Supervision of other normal pregnancy, antepartum -took waterbirth class, will bring certificate to next visit or upload to mychart - Culture, beta strep (group b only) - Cervicovaginal ancillary only( [place])  2. Anemia during pregnancy in third trimester -s/p feraheme infusion last month and currently taking iron supplements - CBC  Term labor symptoms and general  obstetric precautions including but not limited to vaginal bleeding, contractions, leaking of fluid and fetal movement were reviewed in detail with the patient. Please refer to After Visit Summary for other counseling recommendations.   Return in about 1 week (around 08/14/2019) for Routine OB.  Future Appointments  Date Time Provider Department Center  08/16/2019  9:10 AM Gerrit Heck, CNM [facility] None    Judeth Horn, NP

## 2019-08-07 NOTE — Patient Instructions (Signed)
Before Baby Comes Home Once your baby is home with you, things may become a bit hectic as you map out a schedule around your newborn's patterns. Preparing the things you need at home before that time comes is important. Before your baby arrives, make sure you:  Have all the supplies that you will need to care for your baby.  Know where to go if there is an emergency.  Discuss the baby's arrival with other family members. What supplies will I need? Having the following supplies ready before your baby arrives will help ensure that you are prepared: Large items  Crib or bassinet and mattress. Make sure to follow safe sleep recommendations to reduce the risk of sudden infant death syndrome.  Rear-facing infant car seat. Have a trained professional check to make sure that it is installed in your car correctly. Many hospitals and fire departments perform this service free of charge.  Stroller. Always make sure any products--including cribs, mattresses, bassinets, or portable cribs and play areas--are safe. Check for recalls on your specific brand and model of crib. Breastfeeding  Nursing pillow.  Milk storage containers or bags.  Nipple cream.  Nursing bra.  Breast pads.  Breast pump.  Breast shields. Feeding  Formula.  Purified bottled water.  6-8 bottles (4-5 oz bottles and 8-9 oz bottles).  6-8 bottle nipples.  Bibs and burp cloths.  Bottle brush.  Bottle sterilizer (or a pot with a lid). Bathing  Infant bath basin.  Mild baby soap and baby shampoo.  Soft cloth towel and washcloth.  Hooded towel. Diapering  Diapers. You may need to use as many as 10-12 diapers each day.  Baby wipes.  Diaper cream.  Petroleum jelly.  Changing pad.  Hand sanitizer. Health and safety  Rectal thermometer.  Infant medicines.  Bulb syringe.  Baby nail clippers.  Baby monitor.  2-3 pacifiers, if desired. Sleeping  Sleep sack or swaddling blanket.  Firm  mattress pad and fitted sheets for the crib or bassinet. Other supplies  Diaper bag.  Clothing, including one-piece outfits and pajamas.  Receiving blankets. Follow these instructions at home: Preparing for an emergency Prepare for an emergency by taking these steps:  Know when to seek care or call your health care provider.  Know how to get to the nearest hospital.  List the phone numbers of your baby's health care providers near your home phone and in your cell phone.  Take an infant first aid and CPR class.  Place the phone number for the poison control center on your refrigerator.  If there will be caregivers in the home, make sure your phone number, emergency contacts, and address are placed on the refrigerator in case they need to be given to emergency services. Preparing your family   Create a plan for visitors. Keep your baby away from people who have a cough, fever, or other symptoms of illness.  Prepare freezer meals ahead of time, and ask friends and family to help with meal preparation, errands, and everyday tasks.  If you have other children: ? Talk with them about the baby coming home. Ask them how they feel about it. ? Read a book together about being a new big brother or sister. ? Find ways to let them help you prepare for the new baby. ? Have someone ready to care for them while you are in the hospital. Where to find more information  Consumer Product Safety Commission: www.cpsc.gov  American Academy of Pediatrics: www.healthychildren.org  Safe Kids   Worldwide: www.safekids.org Summary  Planning is important before bringing your baby home from the hospital. You will need to have certain supplies ready before your baby arrives.  You will need to have a rear-facing infant car seat ready prior to bringing your baby home. Have a trained professional check to make sure that it is installed in your car correctly.  Always make sure any products--including  cribs, mattresses, bassinets, or portable cribs and play areas--are safe. Check for recalls on your specific brand and model of crib.  Know when to seek care or call your health care provider, and know how to get to the nearest hospital. This information is not intended to replace advice given to you by your health care provider. Make sure you discuss any questions you have with your health care provider. Document Revised: 12/09/2016 Document Reviewed: 11/16/2016 Elsevier Patient Education  2020 [employer].

## 2019-08-08 ENCOUNTER — Other Ambulatory Visit: Payer: Self-pay | Admitting: Student

## 2019-08-08 DIAGNOSIS — B9689 Other specified bacterial agents as the cause of diseases classified elsewhere: Secondary | ICD-10-CM

## 2019-08-08 LAB — CERVICOVAGINAL ANCILLARY ONLY
Bacterial Vaginitis (gardnerella): POSITIVE — AB
Candida Glabrata: NEGATIVE
Candida Vaginitis: NEGATIVE
Chlamydia: NEGATIVE
Comment: NEGATIVE
Comment: NEGATIVE
Comment: NEGATIVE
Comment: NEGATIVE
Comment: NEGATIVE
Comment: NORMAL
Neisseria Gonorrhea: NEGATIVE
Trichomonas: NEGATIVE

## 2019-08-08 LAB — CBC
Hematocrit: 34.2 % (ref 34.0–46.6)
Hemoglobin: 10.7 g/dL — ABNORMAL LOW (ref 11.1–15.9)
MCH: 24.5 pg — ABNORMAL LOW (ref 26.6–33.0)
MCHC: 31.3 g/dL — ABNORMAL LOW (ref 31.5–35.7)
MCV: 78 fL — ABNORMAL LOW (ref 79–97)
Platelets: 153 10*3/uL (ref 150–450)
RBC: 4.36 x10E6/uL (ref 3.77–5.28)
RDW: 21.5 % — ABNORMAL HIGH (ref 11.7–15.4)
WBC: 10.5 10*3/uL (ref 3.4–10.8)

## 2019-08-08 MED ORDER — METRONIDAZOLE 500 MG PO TABS: 500.0000 mg | ORAL_TABLET | Freq: Two times a day (BID) | ORAL | 0 refills | Status: DC

## 2019-08-10 LAB — CULTURE, BETA STREP (GROUP B ONLY): Strep Gp B Culture: POSITIVE — AB

## 2019-08-13 NOTE — Progress Notes (Signed)
CBC with Hgb of 8.0 with MCV of 77 c/w iron deficiency anemia. For IV iron infusion. 

## 2019-08-13 NOTE — Addendum Note (Signed)
Encounter addended by: Reva Bores, MD on: 08/13/2019 9:10 AM  Actions taken: Problem List modified, Clinical Note Signed

## 2019-08-16 ENCOUNTER — Other Ambulatory Visit: Payer: Self-pay

## 2019-08-16 ENCOUNTER — Encounter: Payer: Self-pay | Admitting: General Practice

## 2019-08-16 ENCOUNTER — Ambulatory Visit (INDEPENDENT_AMBULATORY_CARE_PROVIDER_SITE_OTHER): Payer: Medicaid Other

## 2019-08-16 VITALS — BP 117/79 | HR 91 | Temp 98.4°F | Wt 153.6 lb

## 2019-08-16 DIAGNOSIS — Z3480 Encounter for supervision of other normal pregnancy, unspecified trimester: Secondary | ICD-10-CM

## 2019-08-16 DIAGNOSIS — O99019 Anemia complicating pregnancy, unspecified trimester: Secondary | ICD-10-CM

## 2019-08-16 NOTE — Progress Notes (Signed)
   LOW-RISK PREGNANCY OFFICE VISIT  Patient name: Karen Joseph MRN [id number]  Date of birth: 05-Sep-2000 Chief Complaint:   Routine Prenatal Visit  Subjective:   Karen Joseph is a 19 y.o. G61P0010 female at [redacted]w[redacted]d with an Estimated Date of Delivery: 08/31/19 being seen today for ongoing management of a low-risk pregnancy aeb has Supervision of other normal pregnancy, antepartum; Carpal tunnel syndrome on both sides; and Anemia in pregnancy, unspecified trimester on their problem list.  Patient presents today without complaints. Patient endorses fetal movement and denies vaginal concerns including abnormal discharge, leaking of fluid, and bleeding. Patient questions visitor policy at [facility]. Contractions: Not present. Vag. Bleeding: None.  Movement: Present.  Reviewed past medical,surgical, social, obstetrical and family history as well as problem list, medications and allergies.  Objective   Vitals:   08/16/19 0911  BP: 117/79  Pulse: 91  Temp: 98.4 F (36.9 C)  Weight: 153 lb 9.6 oz (69.7 kg)  Body mass index is 30 kg/m.  Total Weight Gain:33 lb 9.6 oz (15.2 kg)         Physical Examination:   General appearance: Well appearing, and in no distress  Mental status: Alert, oriented to person, place, and time  Skin: Warm & dry  Cardiovascular: Normal heart rate noted  Respiratory: Normal respiratory effort, no distress  Abdomen: Soft, gravid, nontender, AGA with Fundal height of Fundal Height: 37 cm  Pelvic: Cervical exam deferred           Extremities: Edema: None  Fetal Status: Fetal Heart Rate (bpm): 136  Movement: Present   No results found for this or any previous visit (from the past 24 hour(s)).  Assessment & Plan:  Low-risk pregnancy of a 19 y.o., G2P0010 at [redacted]w[redacted]d with an Estimated Date of Delivery: 08/31/19   1. Supervision of other normal pregnancy, antepartum -Discussed contraception methods. Patient expresses desire for PPIUD-paragard. -Reviewed usage of pain  medication, if necessary, for placement. -Reviewed WB desires.  Patient has certificate today. -Consents signed.  -Reviewed visitor policy at [facility].  Informed that 2 visitors only and that they can not switch out. -Warned that with rising emergence of Delta V[facility] may call for decreased visitors and implement restrictions.   2. Anemia in pregnancy, unspecified trimester -S/P Iron Infusion -Taking iron once q three days! -Encouraged to try to increase to at least once a day or every other day. -Reviewed most recent HgB level at 10.7    Meds: No orders of the defined types were placed in this encounter.  Labs/procedures today:  Lab Orders  No laboratory test(s) ordered today     Reviewed: Term labor symptoms and general obstetric precautions including but not limited to vaginal bleeding, contractions, leaking of fluid and fetal movement were reviewed in detail with the patient.  All questions were answered.  Follow-up: Return in about 1 week (around 08/23/2019) for LROB.  No orders of the defined types were placed in this encounter.  Cherre Robins MSN, CNM 08/16/2019

## 2019-08-16 NOTE — Patient Instructions (Addendum)
Intrauterine Device Information An intrauterine device (IUD) is a medical device that is inserted in the uterus to prevent pregnancy. It is a small, T-shaped device that has one or two nylon strings hanging down from it. The strings hang out of the lower part of the uterus (cervix) to allow for future IUD removal. There are two types of IUDs available:  Hormone IUD. This type of IUD is made of plastic and contains the hormone progestin (synthetic progesterone). A hormone IUD may last 3-5 years.  Copper IUD. This type of IUD has copper wire wrapped around it. A copper IUD may last up to 10 years. How is an IUD inserted? An IUD is inserted through the vagina and placed into the uterus with a minor medical procedure. The exact procedure for IUD insertion may vary among health care providers and hospitals. How does an IUD work? Synthetic progesterone in a hormonal IUD prevents pregnancy by:  Thickening cervical mucus to prevent sperm from entering the uterus.  Thinning the uterine lining to prevent a fertilized egg from being implanted there. Copper in a copper IUD prevents pregnancy by making the uterus and fallopian tubes produce a fluid that kills sperm. What are the advantages of an IUD? Advantages of either type of IUD  It is highly effective in preventing pregnancy.  It is reversible. You can become pregnant shortly after the IUD is removed.  It is low-maintenance and can stay in place for a long time.  There are no estrogen-related side effects.  It can be used when breastfeeding.  It is not associated with weight gain.  It can be inserted right after childbirth, an abortion, or a miscarriage. Advantages of a hormone IUD  If it is inserted within 7 days of your period starting, it works right after it is inserted. If the hormone IUD is inserted at any other time in your cycle, you will need to use a backup method of birth control for 7 days after insertion.  It can make  menstrual periods lighter.  It can reduce menstrual cramping.  It can be used for 3-5 years. Advantages of a copper IUD  It works right after it is inserted.  It can be used as a form of emergency birth control if it is inserted within 5 days after having unprotected sex.  It does not interfere with your body's natural hormones.  It can be used for 10 years. What are the disadvantages of an IUD?  An IUD may cause irregular menstrual bleeding for a period of time after insertion.  You may have pain during insertion and have cramping and vaginal bleeding after insertion.  An IUD may cut the uterus (uterine perforation) when it is inserted. This is rare.  An IUD may cause pelvic inflammatory disease (PID), which is an infection in the uterus and fallopian tubes. This is rare, and it usually happens during the first 20 days after the IUD is inserted.  A copper IUD can make your menstrual flow heavier and more painful. How is an IUD removed?  You will lie on your back with your knees bent and your feet in footrests (stirrups).  A device will be inserted into your vagina to spread apart the vaginal walls (speculum). This will allow your health care provider to see the strings attached to the IUD.  Your health care provider will use a small instrument (forceps) to grasp the IUD strings and pull firmly until the IUD is removed. You may have some discomfort   when the IUD is removed. Your health care provider may recommend taking over-the-counter pain relievers, such as ibuprofen, before the procedure. You may also have minor spotting for a few days after the procedure. The exact procedure for IUD removal may vary among health care providers and hospitals. Is the IUD right for me? Your health care provider will make sure you are a good candidate for an IUD and will discuss the advantages, disadvantages, and possible side effects with you. Summary  An intrauterine device (IUD) is a medical  device that is inserted in the uterus to prevent pregnancy. It is a small, T-shaped device that has one or two nylon strings hanging down from it.  A hormone IUD contains the hormone progestin (synthetic progesterone). A copper IUD has copper wire wrapped around it.  Synthetic progesterone in a hormone IUD prevents pregnancy by thickening cervical mucus and thinning the walls of the uterus. Copper in a copper IUD prevents pregnancy by making the uterus and fallopian tubes produce a fluid that kills sperm.  A hormone IUD can be left in place for 3-5 years. A copper IUD can be left in place for up to 10 years.  An IUD is inserted and removed by a health care provider. You may feel some pain during insertion and removal. Your health care provider may recommend taking over-the-counter pain medicine, such as ibuprofen, before an IUD procedure. This information is not intended to replace advice given to you by your health care provider. Make sure you discuss any questions you have with your health care provider. Document Revised: 12/09/2016 Document Reviewed: 01/26/2016 Elsevier Patient Education  2020 Elsevier Inc.  AREA PEDIATRIC/FAMILY PRACTICE PHYSICIANS  [place] ([id number]) . [facility] Melodie Bouillono Chambliss, MD; Lum BabeEniola, MD; Sheffield SliderHale, MD; Leveda AnnaHensel, MD; McDiarmid, MD; Jerene BearsMcIntyer, MD; Jennette KettleNeal, MD; Gwendolyn GrantWalden, MD o [address] [place], [place] [address] o [phone] o Mon-Fri 8:30-12:30, 1:30-5:00 o Providers come to see babies at [facility] o Accepting Medicaid . [facility] [place] o Limited providers who accept newborns: Docia ChuckKoirala, MD; Kateri PlummerMorrow, MD; Paulino RilyWolters, MD o [address], [place], [place] [address] o [phone] o Mon-Fri 8:00-5:30 o Babies seen by providers at [facility] o Does NOT accept Medicaid o Please call early in hospitalization for appointment (limited availability)  . [facility] Fatima Sangero Mulberry, MD o [address] [place], [place] [address] o [phone] o Mon, Tue, Thur, Fri 8:30-5:00, Wed 10:00-7:00 (closed 1-2pm) o Babies seen by [facility] providers o Accepting Medicaid . Donnie Coffinubin - Pediatrician Fae Pippino Rubin, MD o [address], [place], [place] [address] o [phone] o Mon-Fri 8:30-5:00, Sat 8:30-12:00 o Provider comes to see babies at [facility] o Accepting Medicaid o Must have been referred from current patients or contacted office prior to delivery . [facility] Leotis Paino Brown, MD; Ave Filterhandler, MD; Luna FuseEttefagh, MD; Kennedy BuckerGrant, MD; Konrad DoloresLester, MD; Kathlene NovemberMcCormick, MD; Jenne CampusMcQueen, MD; Lubertha SouthProse, MD; Wynetta EmerySimha, MD; Duffy RhodyStanley, MD; Gerre CouchStryffeler, NP; Shirl Harrisebben, NP o [address], [place], [place] [address] o [phone] o Mon, Tue, Thur, Fri 8:30-5:30, Wed 9:30-5:30, Sat 8:30-12:30 o Babies seen by [facility] providers o Accepting Medicaid o Only accepting infants of first-time parents or siblings of current patients [facility] discharge coordinator will make follow-up appointment . Cyril MourningJack Amos o [address], [place], [place]  [address] o [phone]   Fax - [phone] . [facility]  o [address], [place], [place]  [address] o Phone - [phone]   Fax - [phone] . Lucio Edward o [address], [place], [place]  [address] o [phone]  [place] [phone]) . [facility] Jorge Mandril, MD; Alita Chyle, MD; Princella Ion, MD; MD; Earlene Plater, MD; Jamesetta Orleans, MD; Alvera Novel, MD; Clarene Duke, MD; Rana Snare, MD; Carmon Ginsberg, MD; Alinda Money, MD; Hosie Poisson, MD; Mayford Knife, MD o [address] [place], [place] [address] o [phone] o Mon-Fri 8:30-5:00 (extended evenings Mon-Thur as needed), Sat-Sun 10:00-1:00 o Providers come to see babies at [facility] o Accepting Medicaid for families of first-time babies and families with all children in the household  age 85 and under. Must register with office prior to making appointment (M-F only). [facility] Odella Aquas, NP; Lynelle Doctor, MD; Susann Givens, MD; [employer], [place] o [address] [place], [place] [address] o [phone] o Mon-Fri 8:00-5:00 o Babies seen by providers at [facility] o Does NOT accept Medicaid/Commercial Insurance Only . [facility] [place] ([facility])  Suzette Battiest, MD; Zachery Dauer, MD; Stefan Church, MD; Sabino Dick, MD; Quitman Livings, MD; Farris Has, MD; Gaynell Face, MD; Betha Loa, MD; Colon Flattery, MD; Clifton James, MD o [address] [place] [place], [place] [address] o [phone] o Mon-Fri 8:30-5:30, Sat (Oct.-Mar.) 9:00-1:00 o Babies seen by providers at [facility] o Accepting [facility] [phone]) . [facility] Dimitri, MD; Sheliah Hatch, MD o [address], [place], [place] [address] o [phone] o Mon-Fri 8:30-5:00, Sat 8:30-12:00 o Providers come to see babies at [facility] o Does NOT accept Medicaid . [facility] Cindy Hazy, [place]; Manele, MD; [place], [place]; Wynelle Link, MD; Azucena Cecil, MD o [address], [place], [place] [address] o [phone] o Mon-Fri 8:00-5:00 o Babies seen by providers at [facility] o Does NOT accept Medicaid o Only accepting babies of parents who are patients o Please call early in hospitalization for appointment (limited availability) . [facility] Lamar Benes, MD; Abran Cantor, MD; Early Osmond, MD; Cherre Huger, NP; Hyacinth Meeker, MD; Dwan Bolt, MD; Jarold Motto, NP; Dario Guardian, MD; Talmage Nap, MD; Maisie Fus, MD; Pricilla Holm, MD; Tama High, MD o [address] [place], [place], [place] [address] o [phone] o Mon-Fri 8:00-5:00, Sat 9:00-12:00 o Providers come to see babies at [facility] o Does NOT accept [facility] [phone]) . [facility] o Limited providers accepting new patients: Drema Pry, NP; [place], [place] o [address] [place], [place] [address] o [phone] o Mon-Fri 8:00-5:00 o Babies seen by providers at [facility] o Does NOT accept Medicaid o Only accepting babies of parents who are patients o Please call early in hospitalization for appointment (limited availability) . [facility] Luan Pulling, MD; Nash Dimmer, MD o [address] [place] [place], [place] [address] o [phone] (press 1 to schedule appointment) o Mon-Fri 8:00-5:00 o Providers come to see babies at [facility] o Does NOT accept Medicaid . [facility] Cristino Martes, MD o [address], [place], [place] [address] o [phone] o Mon-Fri 8:30-5:00 (lunch 12:30-1:00), extended hours by appointment only Wed 5:00-6:30 o Babies seen by [facility] providers o Accepting Medicaid . [facility] Ab[facility], MD; [place], MD; Hassan Rowan, MD o [address] [place], [place], [place] [address] o [phone] o Mon-Fri 8:00-5:00 o Babies seen by [facility] providers o Does NOT accept Medicaid . Nature conservation officer at [facility] [address] [place] Soho, MD; Durene Cal, MD; [place], [place] o [address]  [place], [place] [address] o [phone] o Mon-Fri 8:00-5:00 o Babies seen by [facility] providers o Does NOT accept Medicaid . [facility] o [place], [place]; [place], [place]; [place], NP; Avis Epley, MD; Vonna Kotyk, MD; Clance Boll, MD; Stevphen Rochester, NP; Arvilla Market, NP; Ann Maki, NP; Otis Dials, NP; Vaughan Basta, MD; [place], MD o [address] [place], [place] [address] o [phone] o Mon-Fri 8:30-5:00, Sat 10:00-1:00 o Providers come to see babies at [facility] o Does NOT accept Medicaid o Free prenatal information session Tuesdays at 4:45pm . [facility] Luna Kitchens, MD; [place], [place]; [place], [place]; [place], [place] o [address] [place] [address] o [phone] o Mon-Fri 7:30-5:30 o Babies seen by [facility] providers . [facility] o [address],  [place], [place]  [address] o [phone]   Fax - [phone]  [place] [phone] & [phone]) . [facility] Alphonsa Overall, MD o [address] [place], [place] [address] o [phone] o Mon-Thur 8:00-6:00 o Providers come to see babies at [facility] o Accepting Medicaid . [facility] Zenon Mayo, NP; Cyndia Bent, MD; [place], [place]; [place], [place] o [address] [place], [place] [address] o [phone] o Mon-Thur 7:30-7:30, Fri 7:30-4:30 o Babies seen by [facility] providers o Accepting Medicaid . [facility] Cheryle Horsfall, MD; Janene Harvey, NP; Vonita Moss, MD o [address], [place], [place] [address] o [phone] o Mon-Fri 8:30-5:00, Sat 8:30-12:00 o Providers come to see babies at [facility] o Accepting Medicaid o Must have "Meet & Greet" appointment at office prior to delivery . [facility] [place] [facility] Llana Aliment, MD; Earlene Plater, MD; Lucretia Roers, MD o [address], [place], [place] [address] o [phone] o Mon-Wed 8:00-6:00, Thur-Fri 8:00-5:00, Sat 9:00-12:00 o Providers come to see babies at [facility] o Does NOT accept Medicaid o Only accepting siblings of current patients . [facility] [place]  o [address], [place], [place]  [address] o [phone]   Fax - [phone] . [facility] at [facility] o [phone] [place], [place]  [address] o [phone]   Fax - [phone]  [place] [phone] & [phone]) . Nature conservation officer at [facility] o [place], DO; [place], DO o [address] [place], [place] [address] o [phone] o Mon-Fri 7:00-5:00 o Babies seen by [facility] providers o Does NOT accept Medicaid . [facility] Ellis Savage, MD; [place], [place]; [place], [place] o [address], [place], [place]  [address] o [phone] o Mon-Fri 8:00-5:00 o Babies seen by [facility] providers o Accepting Medicaid . [facility] - [address] Franne Forts, MD; [place], [place]; Allison, NP; [place], [place] o [address] [place], [place], [place] [address] o [phone] o Mon-Fri 8:00-5:00 o Babies seen by providers at [facility] o Accepting [facility] [place] [phone]) . [facility] [place], [place] o [address] [place], [place] [address] o [phone] o Mon-Fri 8:00-5:00 o Babies seen by [facility] providers o Does NOT accept Medicaid o Limited availability, please call early in hospitalization to schedule follow-up . [facility] Jolee Ewing, PA; Eddie Candle, MD; [place], MD; [place], [place]; Constance Goltz, MD; [place], [place] o [address] [facility] [address], [place], [place] [address] o [phone] o Mon-Fri 8:30-5:00, Sat 9:00-12:00 o Babies seen by providers at [facility] o Accepting Medicaid o Please  register online then schedule online or call office o [email or web address] . [facility]) Samuella Bruin, NP; Lucianne Muss, MD; Lanier Clam, PA o [address], [place], [place] [address] o [phone] o Mon-Fri 8:00-5:00 o Babies seen by providers at [facility] o Accepting Medicaid . [facility] [place] [facility]) Sharin Mons, MD; Reed Breech, NP; Shelva Majestic, MD o [address], [place], [place] [address] o [phone] o Mon-Fri 8:00-5:30, Sat&Sun by appointment (phones open at 8:30) o Babies seen by [facility] providers o Accepting Medicaid o Must be a first-time baby or sibling of current patient . [facility] [address], [place], [place]  [address] o [phone]   Fax - [phone]  [place] [phone] & [phone]) . [facility] o [place], [place];  [place], [place]; Dimple Casey, MD; [place], [place]; Carolyne Fiscal, MD o [address], [place], [place] [address] o [phone] o Mon-Thur 8:00-7:00, Fri 8:00-5:00, Sat 8:00-12:00, Sun 9:00-12:00 o Babies seen by [facility] providers o Accepting Medicaid . [facility] at [facility], MD; Gaynell Face, MD; [facility] Ort[facility], MD o [address], [place], [place] [address] o [phone] o Mon-Thur 8:00-5:00 o Babies seen by providers at [facility] o Accepting Medicaid . [facility] [place] Gwenlyn Saran, MD; Coe-Goins, MD; Madilyn Fireman, MD; Melvyn Neth, MD; List, MD; Lazarus Salines, MD; Gaynell Face, MD; Berneda Rose, MD; Flora Lipps, MD; Beryl Meager, MD; Luther Redo, MD; Lavonia Drafts, MD; Kellie Simmering, MD o [address] [place] [place], [place] [address] o [phone] o Mon-Fri 8:00-5:30, Sat (Oct.-Mar.) 9:00-1:00 o Babies seen by providers at [facility] o Accepting Medicaid o Must fill out new patient packet, available online at [email or web address] . [facility] [place] [facility] at [facility]) Simone Curia, NP; Tiburcio Pea, NP; Tresa Endo, NP; Whitney Post, MD; [place], [place]; Hennie Duos, MD; Wynne Dust, MD; Kavin Leech, NP o [address], [place], [place] [address] o [phone] o Mon-Thur 8:00-5:30, Fri 8:00-5:00 o Babies seen by providers at [facility] o Accepting [facility] [phone]) . [facility] o [place], [place]; [place], MD; Tanya Nones, MD; [place], [place] o [address] [place], [place] [address] o [phone] o Mon-Fri 8:00-5:00 o Babies seen by providers at [facility] o Accepting [facility] [phone]) . [facility] at [facility] o [place], DO; Lenise Arena, MD; [place], [place] o [address], [place], [place] [address] o [phone] o Mon-Fri 8:00-5:00 o Babies seen by providers at [facility] o Does NOT accept Medicaid o Limited appointment availability, please call  early in hospitalization  . Nature conservation officer at [facility] o Russell, DO; [place], MD o [address] [place], [place] [address] o [phone] o Mon-Fri 8:00-5:00 o Babies seen by [facility] providers o Does NOT accept Medicaid . [facility] Lorrine Kin, MD; Ninetta Lights, MD; [place], [place]; [place], MD o [address], [place], [place] [address] o [phone] o Mon-Fri 8:00-5:00 o After hours clinic [facility] [place], [place] [address]) [phone] Mon-Fri 5:00-8:00, Sat 12:00-6:00, Sun 10:00-4:00 o Babies seen by [facility] providers o Accepting Medicaid . [facility] at [facility] o [address], [place], [place]  [address] o [phone]   Fax - [phone]  [place] [phone]) .  Nature conservation officer at [facility], MD o [address] [place], [place], [place] [address] o [phone] o Mon-Fri 8:00-5:00 o Babies seen by [facility] providers o Does NOT accept Medicaid . [facility] - [place] [facility] [place]) Tomi Likens, MD o [address] [place] [address] [place], [place] [address] o [phone] o Mon-Thur 8:00-7:00, Fri 8:00-5:00, Sat 8:00-12:00 o Babies seen by providers at [facility] o Accepting Medicaid - but does not have vaccinations in office (must be received elsewhere) o Limited availability, please call early in hospitalization  [place] ([phone]) . [facility]  o Wyvonne Lenz, MD o [address] [place] [address] o [phone]  Fax [phone]

## 2019-08-23 ENCOUNTER — Other Ambulatory Visit: Payer: Self-pay

## 2019-08-23 ENCOUNTER — Ambulatory Visit (INDEPENDENT_AMBULATORY_CARE_PROVIDER_SITE_OTHER): Payer: Medicaid Other | Admitting: Certified Nurse Midwife

## 2019-08-23 VITALS — BP 125/81 | HR 72 | Wt 155.1 lb

## 2019-08-23 DIAGNOSIS — Z3A38 38 weeks gestation of pregnancy: Secondary | ICD-10-CM

## 2019-08-23 DIAGNOSIS — Z3480 Encounter for supervision of other normal pregnancy, unspecified trimester: Secondary | ICD-10-CM

## 2019-08-23 NOTE — Patient Instructions (Signed)
Considering Waterbirth? Guide for patients at [facility] [employer] [facility] Why consider waterbirth? . Gentle birth for babies  . Less pain medicine used in labor  . May allow for passive descent/less pushing  . May reduce perineal tears  . More mobility and instinctive maternal position changes  . Increased maternal relaxation  . Reduced blood pressure in labor   Is waterbirth safe? What are the risks of infection, drowning or other complications? . Infection:  Marland Kitchen Very low risk (3.7 % for tub vs 4.8% for bed)  . 7 in 8000 waterbirths with documented infection  . Poorly cleaned equipment most common cause  . Slightly lower group B strep transmission rate  . Drowning  . Maternal:  . Very low risk  . Related to seizures or fainting  . Newborn:  Marland Kitchen Very low risk. No evidence of increased risk of respiratory problems in multiple large studies  . Physiological protection from breathing under water  . Avoid underwater birth if there are any fetal complications  . Once baby's head is out of the water, keep it out.  . Birth complication  . Some reports of cord trauma, but risk decreased by bringing baby to surface gradually  . No evidence of increased risk of shoulder dystocia. Mothers can usually change positions faster in water than in a bed, possibly aiding the maneuvers to free the shoulder.   If you plan a waterbirth at [facility] at [facility], you can opt to purchase the following: . Fish Net . Bathing suit top (optional)  . Long-handled mirror (optional)  .  Things that would prevent you from having a waterbirth: . Unknown or Positive COVID-19 diagnosis upon admission to hospital  . Premature, <37wks  . Previous cesarean birth  . Presence of thick meconium-stained fluid  . Multiple gestation (Twins, triplets, etc.)  . Uncontrolled diabetes or gestational diabetes requiring medication  . Hypertension requiring medication or diagnosis of pre-eclampsia   . Heavy vaginal bleeding  . Non-reassuring fetal heart rate  . Active infection (MRSA, etc.). Group B Strep is NOT a contraindication for waterbirth.  . If your labor has to be induced and induction method requires continuous monitoring of the baby's heart rate  . Other risks/issues identified by your obstetrical provider  Please remember that birth is unpredictable. Under certain unforeseeable circumstances your provider may advise against giving birth in the tub. These decisions will be made on a case-by-case basis and with the safety of you and your baby as our highest priority.  **Please remember that in order to have a waterbirth, you must test Negative to COVID-19 upon admission to the hospital.**

## 2019-08-23 NOTE — Progress Notes (Signed)
° °  PRENATAL VISIT NOTE  Subjective:  Karen Joseph is a 19 y.o. G2P0010 at [redacted]w[redacted]d being seen today for ongoing prenatal care.  She is currently monitored for the following issues for this low-risk pregnancy and has Supervision of other normal pregnancy, antepartum; Carpal tunnel syndrome on both sides; and Anemia in pregnancy, unspecified trimester on their problem list.  Patient reports no complaints.  Contractions: Not present. Vag. Bleeding: None.  Movement: Present. Denies leaking of fluid.   The following portions of the patient's history were reviewed and updated as appropriate: allergies, current medications, past family history, past medical history, past social history, past surgical history and problem list.   Objective:   Vitals:   08/23/19 1123  BP: 125/81  Pulse: 72  Weight: 155 lb 1.6 oz (70.4 kg)    Fetal Status: Fetal Heart Rate (bpm): 141 Fundal Height: 38 cm Movement: Present  Presentation: Vertex  General:  Alert, oriented and cooperative. Patient is in no acute distress.  Skin: Skin is warm and dry. No rash noted.   Cardiovascular: Normal heart rate noted  Respiratory: Normal respiratory effort, no problems with respiration noted  Abdomen: Soft, gravid, appropriate for gestational age.  Pain/Pressure: Absent     Pelvic: Cervical exam performed in the presence of a chaperone Dilation: Fingertip Effacement (%): Thick Station: Ballotable  Extremities: Normal range of motion.  Edema: None  Mental Status: Normal mood and affect. Normal behavior. Normal judgment and thought content.   Assessment and Plan:  Pregnancy: G2P0010 at [redacted]w[redacted]d 1. Supervision of other normal pregnancy, antepartum - Pt has decided she'd prefer the Liletta IUD to the Paraguard as she has a history of anemia r/t heavy menstrual bleeding  2. [redacted] weeks gestation of pregnancy  Term labor symptoms and general obstetric precautions including but not limited to vaginal bleeding, contractions, leaking  of fluid and fetal movement were reviewed in detail with the patient. Please refer to After Visit Summary for other counseling recommendations.   Return in about 1 week (around 08/30/2019) for LOB.  Future Appointments  Date Time Provider Department Center  08/28/2019  1:50 PM Bernerd Limbo, CNM [facility] None   Edd Arbour, CNM, MSN, [facility] 08/23/19 11:41 AM

## 2019-08-28 ENCOUNTER — Ambulatory Visit (INDEPENDENT_AMBULATORY_CARE_PROVIDER_SITE_OTHER): Payer: Medicaid Other | Admitting: Certified Nurse Midwife

## 2019-08-28 ENCOUNTER — Other Ambulatory Visit: Payer: Self-pay

## 2019-08-28 VITALS — BP 118/73 | HR 88 | Temp 98.3°F | Wt 157.4 lb

## 2019-08-28 DIAGNOSIS — Z3A39 39 weeks gestation of pregnancy: Secondary | ICD-10-CM

## 2019-08-28 DIAGNOSIS — Z3480 Encounter for supervision of other normal pregnancy, unspecified trimester: Secondary | ICD-10-CM

## 2019-08-28 NOTE — Progress Notes (Signed)
   PRENATAL VISIT NOTE  Subjective:  Karen Joseph is a 19 y.o. G2P0010 at [redacted]w[redacted]d being seen today for ongoing prenatal care.  She is currently monitored for the following issues for this low-risk pregnancy and has Supervision of other normal pregnancy, antepartum; Carpal tunnel syndrome on both sides; and Anemia in pregnancy, unspecified trimester on their problem list.  Patient reports occasional contractions and vaginal pressure that is worst at night.  Contractions: Irritability. Vag. Bleeding: None.  Movement: Present. Denies leaking of fluid.   The following portions of the patient's history were reviewed and updated as appropriate: allergies, current medications, past family history, past medical history, past social history, past surgical history and problem list.   Objective:   Vitals:   08/28/19 1358  BP: 118/73  Pulse: 88  Temp: 98.3 F (36.8 C)  Weight: 157 lb 6.4 oz (71.4 kg)    Fetal Status: Fetal Heart Rate (bpm): 161 Fundal Height: 39 cm Movement: Present     General:  Alert, oriented and cooperative. Patient is in no acute distress.  Skin: Skin is warm and dry. No rash noted.   Cardiovascular: Normal heart rate noted  Respiratory: Normal respiratory effort, no problems with respiration noted  Abdomen: Soft, gravid, appropriate for gestational age.  Pain/Pressure: Present     Pelvic: Cervical exam deferred        Extremities: Normal range of motion.  Edema: None  Mental Status: Normal mood and affect. Normal behavior. Normal judgment and thought content.   Assessment and Plan:  Pregnancy: G2P0010 at [redacted]w[redacted]d 1. Supervision of other normal pregnancy, antepartum  2. [redacted] weeks gestation of pregnancy - Discussed IOL scheduling and method/expected progression of IOLs. Pt verbalized understanding and wishes to have it scheduled at next visit.  Term labor symptoms and general obstetric precautions including but not limited to vaginal bleeding, contractions, leaking of  fluid and fetal movement were reviewed in detail with the patient. Please refer to After Visit Summary for other counseling recommendations.   Return in about 1 week (around 09/04/2019) for IN-PERSON, LOB w NST.  Edd Arbour, CNM, MSN, [facility] 08/28/19 2:17 PM

## 2019-08-31 ENCOUNTER — Inpatient Hospital Stay (HOSPITAL_COMMUNITY): Admission: AD | Admit: 2019-08-31 | Payer: Medicaid Other | Source: Home / Self Care

## 2019-09-02 ENCOUNTER — Encounter (HOSPITAL_COMMUNITY): Payer: Self-pay | Admitting: Obstetrics and Gynecology

## 2019-09-02 ENCOUNTER — Other Ambulatory Visit: Payer: Self-pay

## 2019-09-02 ENCOUNTER — Inpatient Hospital Stay (HOSPITAL_COMMUNITY)
Admission: AD | Admit: 2019-09-02 | Discharge: 2019-09-05 | DRG: 806 | Disposition: A | Discharge status: Home / Self Care | Payer: PRIVATE HEALTH INSURANCE | Attending: Family Medicine | Admitting: Family Medicine

## 2019-09-02 DIAGNOSIS — O99019 Anemia complicating pregnancy, unspecified trimester: Secondary | ICD-10-CM | POA: Diagnosis present

## 2019-09-02 DIAGNOSIS — O139 Gestational [pregnancy-induced] hypertension without significant proteinuria, unspecified trimester: Secondary | ICD-10-CM | POA: Diagnosis present

## 2019-09-02 DIAGNOSIS — B951 Streptococcus, group B, as the cause of diseases classified elsewhere: Secondary | ICD-10-CM | POA: Diagnosis present

## 2019-09-02 DIAGNOSIS — O134 Gestational [pregnancy-induced] hypertension without significant proteinuria, complicating childbirth: Secondary | ICD-10-CM | POA: Diagnosis not present

## 2019-09-02 DIAGNOSIS — D696 Thrombocytopenia, unspecified: Secondary | ICD-10-CM | POA: Diagnosis present

## 2019-09-02 DIAGNOSIS — D509 Iron deficiency anemia, unspecified: Secondary | ICD-10-CM | POA: Diagnosis present

## 2019-09-02 DIAGNOSIS — Z3480 Encounter for supervision of other normal pregnancy, unspecified trimester: Secondary | ICD-10-CM

## 2019-09-02 DIAGNOSIS — O9902 Anemia complicating childbirth: Secondary | ICD-10-CM | POA: Diagnosis present

## 2019-09-02 DIAGNOSIS — Z20822 Contact with and (suspected) exposure to covid-19: Secondary | ICD-10-CM | POA: Diagnosis present

## 2019-09-02 DIAGNOSIS — O9912 Other diseases of the blood and blood-forming organs and certain disorders involving the immune mechanism complicating childbirth: Secondary | ICD-10-CM | POA: Diagnosis present

## 2019-09-02 DIAGNOSIS — R030 Elevated blood-pressure reading, without diagnosis of hypertension: Secondary | ICD-10-CM | POA: Diagnosis not present

## 2019-09-02 DIAGNOSIS — Z3A40 40 weeks gestation of pregnancy: Secondary | ICD-10-CM

## 2019-09-02 DIAGNOSIS — O99824 Streptococcus B carrier state complicating childbirth: Secondary | ICD-10-CM | POA: Diagnosis present

## 2019-09-02 NOTE — MAU Provider Note (Signed)
History     CSN: [id number]  Arrival date and time: 09/02/19 2237   First Provider Initiated Contact with Patient 09/02/19 2338      Chief Complaint  Patient presents with  . Foot Swelling  . Hypertension   [name] is a 19 y.o. year old 222P0010 female at 188w3d weeks gestation who presents to MAU reporting swollen feet today and elevated blood pressures at home.  She reports her blood pressures being 140/88 then 135/88 she denies headache blurry vision or right upper quadrant pain.  She denies vaginal bleeding or loss of fluid and no contractions that she can feel.  She reports positive fetal movement today.  She [facility].  She was planning a water birth for this pregnancy.   OB History    Gravida  2   Para      Term      Preterm      AB  1   Living  0     SAB      TAB      Ectopic      Multiple      Live Births  0          OB BOX:  Nursing Staff Provider  Office Location  [facility] Dating  LMP  Language  English Anatomy US  Normal  Flu Vaccine  N/A Genetic Screen  NIPS: Low-Risk  AFP: Negative  First Screen:  Quad:    TDaP vaccine   06/12/19 Hgb A1C or  GTT Early Normal 5.0 Third trimester Normal  Glucose, Fasting 65 - 91 mg/dL 88   Glucose, 1 hour 65 - 179 mg/dL 409129   Glucose, 2 hour 65 - 152 mg/dL 74     Rhogam  N/A   LAB RESULTS     Blood Type O/Positive/-- (04/15 1546)   Feeding Plan Breast Antibody Negative (04/15 1546)  Contraception Pills or IUD Rubella 1.50 (04/15 1546) Immune  Circumcision If boy, yes RPR Non Reactive (04/15 1546)   Pediatrician  Undecided HBsAg Negative (04/15 1546)   Support Person FOB-Karen Joseph HCVAb Negative  Prenatal Classes No HIV Non Reactive (04/15 1546)     BTL Consent N/A GBS  positive  VBAC Consent N/A Pap Normal (04/2019)    Hgb Electro  Negative  BP Cuff Rx [facility] 04/08/19 CF Negative  Weight Scale Has at home SMA  Negative    Waterbirth  [ ]  Class [x]  Consent [x]  CNM visit   Past Medical History:  Diagnosis Date  . Anemia   . Asthma   . Chlamydia   . Eczema     Past Surgical History:  Procedure Laterality Date  . NO PAST SURGERIES      Family History  Problem Relation Age of Onset  . Hypertension Mother   . Diabetes Mother   . Heart disease Mother   . ALS Father   . Asthma Father     Social History   Tobacco Use  . Smoking status: Never Smoker  . Smokeless tobacco: Never Used  Vaping Use  . Vaping Use: Never used  Substance Use Topics  . Alcohol use: Not Currently    Comment: occ  . Drug use: Never    Allergies: No Known Allergies  Medications Prior to Admission  Medication Sig Dispense Refill Last Dose  . albuterol (VENTOLIN HFA) 108 (90 Base) MCG/ACT inhaler Inhale 2 puffs into the lungs every  6 (six) hours as needed for wheezing or shortness of breath. 8 g 2 Past Month at Unknown time  . ascorbic acid (VITAMIN C) 500 MG tablet Take 1 tablet (500 mg total) by mouth every other day. 30 tablet 2 09/02/2019 at Unknown time  . Blood Pressure Monitoring (BLOOD PRESSURE MONITOR AUTOMAT) DEVI 1 Device by Does not apply route daily. Automatic blood pressure cuff regular size. To monitor blood pressure regularly at home. ICD-10 code:Z34.90 1 each 0 09/02/2019 at Unknown time  . Ferrous Sulfate (IRON) 325 (65 Fe) MG TABS Take 1 tablet (325 mg total) by mouth every other day. 30 tablet 2 09/02/2019 at Unknown time  . Prenatal Vit-Fe Fumarate-FA (PRENATAL VITAMIN PO) Take 1 tablet by mouth daily. [place] Prenatal   Past Week at Unknown time  . acetaminophen (TYLENOL) 325 MG tablet Take 325 mg by mouth every 6 (six) hours as needed for mild pain. (Patient not taking: Reported on 08/23/2019)     . Elastic Bandages & Supports (WRIST SPLINT/RIGHT MEDIUM) MISC 1 each by Does not apply route daily. (Patient not taking: Reported on 08/28/2019) 1 each 0   . metroNIDAZOLE (FLAGYL) 500 MG tablet Take 1  tablet (500 mg total) by mouth 2 (two) times daily. (Patient not taking: Reported on 08/23/2019) 14 tablet 0     Review of Systems  Constitutional: Negative.   HENT: Negative.   Eyes: Negative.   Respiratory: Negative.   Cardiovascular: Positive for leg swelling.  Gastrointestinal: Positive for nausea and vomiting (since arrival to MAU).  Endocrine: Negative.   Genitourinary: Positive for pelvic pain (some pressure).  Musculoskeletal: Negative.   Skin: Negative.   Allergic/Immunologic: Negative.   Neurological: Negative.   Hematological: Negative.   Psychiatric/Behavioral: Negative.    Physical Exam   Patient Vitals for the past 24 hrs:  BP Temp Temp src Pulse Resp SpO2 Height Weight  09/03/19 0401 (!) 138/91 -- -- 84 18 -- -- --  09/03/19 0303 (!) 138/94 -- -- 89 18 -- -- --  09/03/19 0200 130/86 -- -- 71 18 -- -- --  09/03/19 0100 (!) 133/91 -- -- 69 -- -- -- --  09/03/19 0058 (!) 133/91 98.1 F (36.7 C) Oral 74 16 -- 5' (1.524 m) 71.2 kg  09/03/19 0016 (!) 140/97 -- -- 79 -- 99 % -- --  09/03/19 0001 (!) 144/101 -- -- 80 -- 99 % -- --  09/02/19 2346 (!) 144/92 -- -- 79 -- -- -- --  09/02/19 2330 (!) 129/96 -- -- (!) 114 -- -- -- --  09/02/19 2315 (!) 131/96 -- -- 90 18 -- -- --  09/02/19 2301 (!) 126/98 98.4 F (36.9 C) Oral 90 -- -- -- --     Physical Exam Vitals and nursing note reviewed.  Constitutional:      Appearance: Normal appearance. She is normal weight.  HENT:     Head: Normocephalic and atraumatic.  Genitourinary:    Comments: Cervical exam deferred Musculoskeletal:        General: Normal range of motion.  Skin:    General: Skin is warm and dry.  Neurological:     Mental Status: She is alert and oriented to person, place, and time.  Psychiatric:        Mood and Affect: Mood normal.        Behavior: Behavior normal.        Thought Content: Thought content normal.        Judgment: Judgment normal.  NST - FHR: 130 bpm / moderate variability /  accels present / decels absent / TOCO: regular every 4-5 mins **not felt UC's  +Bedside U/S not done by MAU provider d/t equipment needing to be decontaminated -- BS U/S performed by Karen Joseph, CNM MAU Course  Procedures  MDM CCUA CBC CMP P/C Ratio Serial BP's    Results for orders placed or performed during the hospital encounter of 09/02/19 (from the past 24 hour(s))  Urinalysis, Routine w reflex microscopic Urine, Clean Catch     Status: Abnormal   Collection Time: 09/02/19 11:15 PM  Result Value Ref Range   Color, Urine YELLOW YELLOW   APPearance CLOUDY (A) CLEAR   Specific Gravity, Urine 1.018 1.005 - 1.030   pH 7.0 5.0 - 8.0   Glucose, UA NEGATIVE NEGATIVE mg/dL   Hgb urine dipstick NEGATIVE NEGATIVE   Bilirubin Urine NEGATIVE NEGATIVE   Ketones, ur NEGATIVE NEGATIVE mg/dL   Protein, ur 30 (A) NEGATIVE mg/dL   Nitrite NEGATIVE NEGATIVE   Leukocytes,Ua MODERATE (A) NEGATIVE   RBC / HPF 0-5 0 - 5 RBC/hpf   WBC, UA 11-20 0 - 5 WBC/hpf   Bacteria, UA FEW (A) NONE SEEN   Squamous Epithelial / LPF 21-50 0 - 5   Mucus PRESENT    Hyaline Casts, UA PRESENT   Protein / creatinine ratio, urine     Status: Abnormal   Collection Time: 09/02/19 11:15 PM  Result Value Ref Range   Creatinine, Urine 101.87 mg/dL   Total Protein, Urine 22 mg/dL   Protein Creatinine Ratio 0.22 (H) 0.00 - 0.15 mg/mg[Cre]  CBC     Status: Abnormal   Collection Time: 09/02/19 11:23 PM  Result Value Ref Range   WBC 9.7 4.0 - 10.5 K/uL   RBC 4.31 3.87 - 5.11 MIL/uL   Hemoglobin 10.7 (L) 12.0 - 15.0 g/dL   HCT 09.6 (L) 36 - 46 %   MCV 77.7 (L) 80.0 - 100.0 fL   MCH 24.8 (L) 26.0 - 34.0 pg   MCHC 31.9 30.0 - 36.0 g/dL   RDW 28.3 (H) 66.2 - 94.7 %   Platelets 130 (L) 150 - 400 K/uL   nRBC 0.0 0.0 - 0.2 %  Comprehensive metabolic panel     Status: Abnormal   Collection Time: 09/02/19 11:23 PM  Result Value Ref Range   Sodium 135 135 - 145 mmol/L   Potassium 3.9 3.5 - 5.1 mmol/L    Chloride 105 98 - 111 mmol/L   CO2 22 22 - 32 mmol/L   Glucose, Bld 81 70 - 99 mg/dL   BUN 9 6 - 20 mg/dL   Creatinine, Ser 6.54 0.44 - 1.00 mg/dL   Calcium 8.6 (L) 8.9 - 10.3 mg/dL   Total Protein 5.9 (L) 6.5 - 8.1 g/dL   Albumin 3.0 (L) 3.5 - 5.0 g/dL   AST 21 15 - 41 U/L   ALT 13 0 - 44 U/L   Alkaline Phosphatase 179 (H) 38 - 126 U/L   Total Bilirubin 0.5 0.3 - 1.2 mg/dL   GFR calc non Af Amer >60 >60 mL/min   GFR calc Af Amer >60 >60 mL/min   Anion gap 8 5 - 15  Type and screen [facility]     Status: None   Collection Time: 09/02/19 11:23 PM  Result Value Ref Range   ABO/RH(D) O POS    Antibody Screen NEG    Sample Expiration  09/05/2019,2359 Performed at [facility], [address] [place], [place] [address]   SARS Coronavirus 2 by RT PCR (hospital order, performed in [facility] hospital lab) Nasopharyngeal Nasopharyngeal Swab     Status: None   Collection Time: 09/03/19 12:29 AM   Specimen: Nasopharyngeal Swab  Result Value Ref Range   SARS Coronavirus 2 NEGATIVE NEGATIVE      Assessment and Plan  Gestational Hypertension - Admit to L&D for IOL - Routine admission and IOL orders - Explained possible methods of induction with patient and FOB - Report called to Dr. Barb Merino @ 0009 - Care assumed by Dr. Barb Merino upon admission  Karen Mora, MSN, CNM 09/02/2019, 11:52 PM

## 2019-09-02 NOTE — MAU Note (Signed)
Pt reports to MAU c/o swelling in her feet that has been ongoing today. Pt reports she has had some increased BP's as followed @ 1600 BP:140/88 and she  Reports the lowest was 135/88. No HA, blurry vision or RUQ pain. +FM. No bleeding or LOF. No ctx.

## 2019-09-03 ENCOUNTER — Encounter (HOSPITAL_COMMUNITY): Payer: Self-pay | Admitting: Obstetrics and Gynecology

## 2019-09-03 ENCOUNTER — Other Ambulatory Visit: Payer: Self-pay

## 2019-09-03 DIAGNOSIS — D696 Thrombocytopenia, unspecified: Secondary | ICD-10-CM | POA: Diagnosis present

## 2019-09-03 DIAGNOSIS — R030 Elevated blood-pressure reading, without diagnosis of hypertension: Secondary | ICD-10-CM | POA: Diagnosis present

## 2019-09-03 DIAGNOSIS — B951 Streptococcus, group B, as the cause of diseases classified elsewhere: Secondary | ICD-10-CM | POA: Diagnosis present

## 2019-09-03 DIAGNOSIS — O480 Post-term pregnancy: Secondary | ICD-10-CM | POA: Diagnosis not present

## 2019-09-03 DIAGNOSIS — O134 Gestational [pregnancy-induced] hypertension without significant proteinuria, complicating childbirth: Secondary | ICD-10-CM | POA: Diagnosis present

## 2019-09-03 DIAGNOSIS — Z20822 Contact with and (suspected) exposure to covid-19: Secondary | ICD-10-CM | POA: Diagnosis present

## 2019-09-03 DIAGNOSIS — O9902 Anemia complicating childbirth: Secondary | ICD-10-CM | POA: Diagnosis present

## 2019-09-03 DIAGNOSIS — O99824 Streptococcus B carrier state complicating childbirth: Secondary | ICD-10-CM | POA: Diagnosis present

## 2019-09-03 DIAGNOSIS — O9912 Other diseases of the blood and blood-forming organs and certain disorders involving the immune mechanism complicating childbirth: Secondary | ICD-10-CM | POA: Diagnosis present

## 2019-09-03 DIAGNOSIS — Z3A40 40 weeks gestation of pregnancy: Secondary | ICD-10-CM | POA: Diagnosis not present

## 2019-09-03 DIAGNOSIS — D509 Iron deficiency anemia, unspecified: Secondary | ICD-10-CM | POA: Diagnosis present

## 2019-09-03 DIAGNOSIS — O139 Gestational [pregnancy-induced] hypertension without significant proteinuria, unspecified trimester: Secondary | ICD-10-CM | POA: Diagnosis present

## 2019-09-03 LAB — COMPREHENSIVE METABOLIC PANEL
ALT: 13 U/L (ref 0–44)
AST: 21 U/L (ref 15–41)
Albumin: 3 g/dL — ABNORMAL LOW (ref 3.5–5.0)
Alkaline Phosphatase: 179 U/L — ABNORMAL HIGH (ref 38–126)
Anion gap: 8 (ref 5–15)
BUN: 9 mg/dL (ref 6–20)
CO2: 22 mmol/L (ref 22–32)
Calcium: 8.6 mg/dL — ABNORMAL LOW (ref 8.9–10.3)
Chloride: 105 mmol/L (ref 98–111)
Creatinine, Ser: 0.44 mg/dL (ref 0.44–1.00)
GFR calc Af Amer: >60 mL/min (ref >60)
GFR calc non Af Amer: >60 mL/min (ref >60)
Glucose, Bld: 81 mg/dL (ref 70–99)
Potassium: 3.9 mmol/L (ref 3.5–5.1)
Sodium: 135 mmol/L (ref 135–145)
Total Bilirubin: 0.5 mg/dL (ref 0.3–1.2)
Total Protein: 5.9 g/dL — ABNORMAL LOW (ref 6.5–8.1)

## 2019-09-03 LAB — CBC
HCT: 33.5 % — ABNORMAL LOW (ref 36.0–46.0)
Hemoglobin: 10.7 g/dL — ABNORMAL LOW (ref 12.0–15.0)
MCH: 24.8 pg — ABNORMAL LOW (ref 26.0–34.0)
MCHC: 31.9 g/dL (ref 30.0–36.0)
MCV: 77.7 fL — ABNORMAL LOW (ref 80.0–100.0)
Platelets: 130 10*3/uL — ABNORMAL LOW (ref 150–400)
RBC: 4.31 MIL/uL (ref 3.87–5.11)
RDW: 20 % — ABNORMAL HIGH (ref 11.5–15.5)
WBC: 9.7 10*3/uL (ref 4.0–10.5)
nRBC: 0 % (ref 0.0–0.2)

## 2019-09-03 LAB — URINALYSIS, ROUTINE W REFLEX MICROSCOPIC
Bilirubin Urine: NEGATIVE
Glucose, UA: NEGATIVE mg/dL
Hgb urine dipstick: NEGATIVE
Ketones, ur: NEGATIVE mg/dL
Nitrite: NEGATIVE
Protein, ur: 30 mg/dL — AB
Specific Gravity, Urine: 1.018 (ref 1.005–1.030)
pH: 7 (ref 5.0–8.0)

## 2019-09-03 LAB — PROTEIN / CREATININE RATIO, URINE
Creatinine, Urine: 101.87 mg/dL
Protein Creatinine Ratio: 0.22 mg/mg{Cre} — ABNORMAL HIGH (ref 0.00–0.15)
Total Protein, Urine: 22 mg/dL

## 2019-09-03 LAB — TYPE AND SCREEN
ABO/RH(D): O POS
Antibody Screen: NEGATIVE

## 2019-09-03 LAB — RPR: RPR Ser Ql: NONREACTIVE

## 2019-09-03 LAB — SARS CORONAVIRUS 2 BY RT PCR (HOSPITAL ORDER, PERFORMED IN ~~LOC~~ HOSPITAL LAB): SARS Coronavirus 2: NEGATIVE

## 2019-09-03 MED ORDER — IBUPROFEN 600 MG PO TABS
600.0000 mg | ORAL_TABLET | Freq: Four times a day (QID) | ORAL | Status: DC
  Administered 2019-09-03 – 2019-09-05 (×7): 600 mg via ORAL
  Filled 2019-09-03 (×7): qty 1

## 2019-09-03 MED ORDER — SODIUM CHLORIDE 0.9 % IV SOLN
5.0000 10*6.[IU] | Freq: Once | INTRAVENOUS | Status: AC
  Administered 2019-09-03: 5 10*6.[IU] via INTRAVENOUS
  Filled 2019-09-03: qty 5

## 2019-09-03 MED ORDER — HYDROXYZINE HCL 50 MG PO TABS: 50.0000 mg | ORAL_TABLET | Freq: Four times a day (QID) | ORAL | Status: DC | PRN

## 2019-09-03 MED ORDER — MISOPROSTOL 50MCG HALF TABLET
50.0000 ug | ORAL_TABLET | Freq: Once | ORAL | Status: AC
  Administered 2019-09-03: 50 ug via BUCCAL
  Filled 2019-09-03: qty 1

## 2019-09-03 MED ORDER — SIMETHICONE 80 MG PO CHEW: 80.0000 mg | CHEWABLE_TABLET | ORAL | Status: DC | PRN

## 2019-09-03 MED ORDER — TERBUTALINE SULFATE 1 MG/ML IJ SOLN: 0.2500 mg | Freq: Once | INTRAMUSCULAR | Status: DC | PRN

## 2019-09-03 MED ORDER — OXYTOCIN BOLUS FROM INFUSION
333.0000 mL | Freq: Once | INTRAVENOUS | Status: AC
  Administered 2019-09-03: 600 mL/h via INTRAVENOUS

## 2019-09-03 MED ORDER — ONDANSETRON HCL 4 MG/2ML IJ SOLN: 4.0000 mg | Freq: Four times a day (QID) | INTRAMUSCULAR | Status: DC | PRN

## 2019-09-03 MED ORDER — OXYTOCIN-SODIUM CHLORIDE 30-0.9 UT/500ML-% IV SOLN
1.0000 m[IU]/min | INTRAVENOUS | Status: DC
  Filled 2019-09-03: qty 500

## 2019-09-03 MED ORDER — COCONUT OIL OIL
1.0000 "application " | TOPICAL_OIL | Status: DC | PRN
  Administered 2019-09-04: 1 via TOPICAL

## 2019-09-03 MED ORDER — DIPHENHYDRAMINE HCL 25 MG PO CAPS: 25.0000 mg | ORAL_CAPSULE | Freq: Four times a day (QID) | ORAL | Status: DC | PRN

## 2019-09-03 MED ORDER — LACTATED RINGERS IV SOLN: INTRAVENOUS | Status: DC

## 2019-09-03 MED ORDER — CITALOPRAM HYDROBROMIDE 20 MG PO TABS
20.0000 mg | ORAL_TABLET | Freq: Every day | ORAL | Status: DC
  Filled 2019-09-03: qty 1

## 2019-09-03 MED ORDER — FENTANYL CITRATE (PF) 100 MCG/2ML IJ SOLN
100.0000 ug | INTRAMUSCULAR | Status: DC | PRN
  Administered 2019-09-03 (×4): 100 ug via INTRAVENOUS
  Filled 2019-09-03 (×4): qty 2

## 2019-09-03 MED ORDER — ONDANSETRON HCL 4 MG PO TABS: 4.0000 mg | ORAL_TABLET | ORAL | Status: DC | PRN

## 2019-09-03 MED ORDER — ZOLPIDEM TARTRATE 5 MG PO TABS: 5.0000 mg | ORAL_TABLET | Freq: Every evening | ORAL | Status: DC | PRN

## 2019-09-03 MED ORDER — PENICILLIN G POT IN DEXTROSE 60000 UNIT/ML IV SOLN
3.0000 10*6.[IU] | INTRAVENOUS | Status: DC
  Administered 2019-09-03 (×4): 3 10*6.[IU] via INTRAVENOUS
  Filled 2019-09-03 (×4): qty 50

## 2019-09-03 MED ORDER — LIDOCAINE HCL (PF) 1 % IJ SOLN
30.0000 mL | INTRAMUSCULAR | Status: AC | PRN
  Administered 2019-09-03: 30 mL via SUBCUTANEOUS
  Filled 2019-09-03: qty 30

## 2019-09-03 MED ORDER — MISOPROSTOL 200 MCG PO TABS
800.0000 ug | ORAL_TABLET | Freq: Once | ORAL | Status: AC
  Administered 2019-09-03: 800 ug via RECTAL

## 2019-09-03 MED ORDER — DIBUCAINE (PERIANAL) 1 % EX OINT: 1.0000 "application " | TOPICAL_OINTMENT | CUTANEOUS | Status: DC | PRN

## 2019-09-03 MED ORDER — ERYTHROMYCIN 5 MG/GM OP OINT
TOPICAL_OINTMENT | OPHTHALMIC | Status: AC
  Filled 2019-09-03: qty 1

## 2019-09-03 MED ORDER — ONDANSETRON HCL 4 MG/2ML IJ SOLN: 4.0000 mg | INTRAMUSCULAR | Status: DC | PRN

## 2019-09-03 MED ORDER — BENZOCAINE-MENTHOL 20-0.5 % EX AERO
1.0000 "application " | INHALATION_SPRAY | CUTANEOUS | Status: DC | PRN
  Administered 2019-09-03: 1 via TOPICAL
  Filled 2019-09-03: qty 56

## 2019-09-03 MED ORDER — OXYTOCIN-SODIUM CHLORIDE 30-0.9 UT/500ML-% IV SOLN: 2.5000 [IU]/h | INTRAVENOUS | Status: DC

## 2019-09-03 MED ORDER — MISOPROSTOL 50MCG HALF TABLET
ORAL_TABLET | ORAL | Status: AC
  Filled 2019-09-03: qty 1

## 2019-09-03 MED ORDER — SENNOSIDES-DOCUSATE SODIUM 8.6-50 MG PO TABS
2.0000 | ORAL_TABLET | ORAL | Status: DC
  Administered 2019-09-03 – 2019-09-04 (×2): 2 via ORAL
  Filled 2019-09-03 (×2): qty 2

## 2019-09-03 MED ORDER — MISOPROSTOL 200 MCG PO TABS
ORAL_TABLET | ORAL | Status: AC
  Filled 2019-09-03: qty 4

## 2019-09-03 MED ORDER — ACETAMINOPHEN 325 MG PO TABS
650.0000 mg | ORAL_TABLET | ORAL | Status: DC | PRN
  Administered 2019-09-03: 650 mg via ORAL
  Filled 2019-09-03: qty 2

## 2019-09-03 MED ORDER — TETANUS-DIPHTH-ACELL PERTUSSIS 5-2.5-18.5 LF-MCG/0.5 IM SUSP: 0.5000 mL | Freq: Once | INTRAMUSCULAR | Status: DC

## 2019-09-03 MED ORDER — OXYTOCIN-SODIUM CHLORIDE 30-0.9 UT/500ML-% IV SOLN
1.0000 m[IU]/min | INTRAVENOUS | Status: DC
  Administered 2019-09-03: 2 m[IU]/min via INTRAVENOUS

## 2019-09-03 MED ORDER — WITCH HAZEL-GLYCERIN EX PADS: 1.0000 "application " | MEDICATED_PAD | CUTANEOUS | Status: DC | PRN

## 2019-09-03 MED ORDER — LACTATED RINGERS IV SOLN
500.0000 mL | INTRAVENOUS | Status: DC | PRN
  Administered 2019-09-03: 500 mL via INTRAVENOUS

## 2019-09-03 MED ORDER — PRENATAL MULTIVITAMIN CH
1.0000 | ORAL_TABLET | Freq: Every day | ORAL | Status: DC
  Administered 2019-09-04 – 2019-09-05 (×2): 1 via ORAL
  Filled 2019-09-03 (×2): qty 1

## 2019-09-03 MED ORDER — ACETAMINOPHEN 325 MG PO TABS: 650.0000 mg | ORAL_TABLET | ORAL | Status: DC | PRN

## 2019-09-03 NOTE — H&P (Addendum)
OBSTETRIC ADMISSION HISTORY AND PHYSICAL  Karen Joseph is a 19 y.o. female G2P0010 with IUP at [redacted]w[redacted]d by L/7 presenting for IOL secondary to elevated blood pressures. She reports +FMs, No LOF, no VB, no blurry vision, headaches or peripheral edema, and RUQ pain.  She plans on breastfeeding. She is considering an outpatient hormonal IUD for birth control, but is still undecided.  She received her prenatal care at  [facility] .  Dating: By L/7 --->  Estimated Date of Delivery: 08/31/19  Sono: @[redacted]w[redacted]d , normal anatomy, cephalic presentation, 841g, 51% EFW  Prenatal History/Complications: anemia  Past Medical History: Past Medical History:  Diagnosis Date   Anemia    Asthma    Chlamydia    Eczema     Past Surgical History: Past Surgical History:  Procedure Laterality Date   NO PAST SURGERIES      Obstetrical History: OB History     Gravida  2   Para      Term      Preterm      AB  1   Living  0      SAB      TAB      Ectopic      Multiple      Live Births  0           Social History Social History   Socioeconomic History   Marital status: Single    Spouse name: Not on file   Number of children: Not on file   Years of education: 12th grade   Highest education level: Some college, no degree  Occupational History   Not on file  Tobacco Use   Smoking status: Never Smoker   Smokeless tobacco: Never Used  Use: Never used  Substance and Sexual Activity   Alcohol use: Not Currently    Comment: occ   Drug use: Never   Sexual activity: Yes    Birth control/protection: None  Other Topics Concern   Not on file  Social History Narrative   Not on file   Social Determinants of Health   Financial Resource Strain:    Difficulty of Paying Living Expenses: Not on file  Food Insecurity:    Worried About Building services engineer in the Last Year: Not on file   Programme researcher, broadcasting/film/video of Food in the Last Year: Not on file  Transportation Needs:    Lack  of Transportation (Medical): Not on file   Lack of Transportation (Non-Medical): Not on file  Physical Activity:    Days of Exercise per Week: Not on file   Minutes of Exercise per Session: Not on file  Stress:    Feeling of Stress : Not on file  Social Connections:    Frequency of Communication with Friends and Family: Not on file   Frequency of Social Gatherings with Friends and Family: Not on file   Attends Religious Services: Not on file   Active Member of Clubs or Organizations: Not on file   Attends [employer] Meetings: Not on file   Marital Status: Not on file    Family History: Family History  Problem Relation Age of Onset   Hypertension Mother    Diabetes Mother    Heart disease Mother    ALS Father    Asthma Father     Allergies: No Known Allergies  Medications Prior to Admission  Medication Sig Dispense Refill Last Dose   albuterol (VENTOLIN HFA) 108 (90  Base) MCG/ACT inhaler Inhale 2 puffs into the lungs every 6 (six) hours as needed for wheezing or shortness of breath. 8 g 2 Past Month at Unknown time   ascorbic acid (VITAMIN C) 500 MG tablet Take 1 tablet (500 mg total) by mouth every other day. 30 tablet 2 09/02/2019 at Unknown time   Blood Pressure Monitoring (BLOOD PRESSURE MONITOR AUTOMAT) DEVI 1 Device by Does not apply route daily. Automatic blood pressure cuff regular size. To monitor blood pressure regularly at home. ICD-10 code:Z34.90 1 each 0 09/02/2019 at Unknown time   Ferrous Sulfate (IRON) 325 (65 Fe) MG TABS Take 1 tablet (325 mg total) by mouth every other day. 30 tablet 2 09/02/2019 at Unknown time   Prenatal Vit-Fe Fumarate-FA (PRENATAL VITAMIN PO) Take 1 tablet by mouth daily. [place] Prenatal   Past Week at Unknown time   acetaminophen (TYLENOL) 325 MG tablet Take 325 mg by mouth every 6 (six) hours as needed for mild pain. (Patient not taking: Reported on 08/23/2019)      Elastic Bandages & Supports (WRIST SPLINT/RIGHT MEDIUM) MISC 1 each by  Does not apply route daily. (Patient not taking: Reported on 08/28/2019) 1 each 0    metroNIDAZOLE (FLAGYL) 500 MG tablet Take 1 tablet (500 mg total) by mouth 2 (two) times daily. (Patient not taking: Reported on 08/23/2019) 14 tablet 0      Review of Systems   All systems reviewed and negative except as stated in HPI  Blood pressure (!) 133/91, pulse 74, temperature 98.1 F (36.7 C), temperature source Oral, resp. rate 16, height 5' (1.524 m), weight 71.2 kg, last menstrual period 11/24/2018, SpO2 99 %. General appearance: alert, cooperative and appears stated age Lungs: clear to auscultation bilaterally Heart: regular rate and rhythm Abdomen: soft, non-tender; bowel sounds normal Extremities: Homans sign is negative, no sign of DVT Presentation: cephalic by bedside US Fetal monitoringBaseline: 135 bpm, Variability: Good {> 6 bpm) and Accelerations: Reactive Uterine activityFrequency: Every 4-6 minutes, Duration: 60-90 seconds and Intensity: mild  Dilation: 1 Effacement (%): Thick Station: -3 Exam by:: Market researcherAmber Pope RN   Prenatal labs: ABO, Rh: --/--/O POS (08/23 2323) Antibody: NEG (08/23 2323) Rubella: 1.50 (04/15 1546) RPR: Non Reactive (06/02 0818)  HBsAg: Negative (04/15 1546)  HIV: Non Reactive (06/02 0818)  GBS: Positive/-- (07/28 1014)  2 hour Glucola wnl  Genetic screening  NIPS low risk Anatomy US wnl  Prenatal Transfer Tool  Maternal Diabetes: No Genetic Screening: Normal Maternal Ultrasounds/Referrals: none Fetal Ultrasounds or other Referrals:  None Maternal Substance Abuse:  No Significant Maternal Medications:  None Significant Maternal Lab Results: Group B Strep positive  Results for orders placed or performed during the hospital encounter of 09/02/19 (from the past 24 hour(s))  Urinalysis, Routine w reflex microscopic Urine, Clean Catch   Collection Time: 09/02/19 11:15 PM  Result Value Ref Range   Color, Urine YELLOW YELLOW   APPearance CLOUDY (A)  CLEAR   Specific Gravity, Urine 1.018 1.005 - 1.030   pH 7.0 5.0 - 8.0   Glucose, UA NEGATIVE NEGATIVE mg/dL   Hgb urine dipstick NEGATIVE NEGATIVE   Bilirubin Urine NEGATIVE NEGATIVE   Ketones, ur NEGATIVE NEGATIVE mg/dL   Protein, ur 30 (A) NEGATIVE mg/dL   Nitrite NEGATIVE NEGATIVE   Leukocytes,Ua MODERATE (A) NEGATIVE   RBC / HPF 0-5 0 - 5 RBC/hpf   WBC, UA 11-20 0 - 5 WBC/hpf   Bacteria, UA FEW (A) NONE SEEN   Squamous Epithelial / LPF  21-50 0 - 5   Mucus PRESENT    Hyaline Casts, UA PRESENT   Protein / creatinine ratio, urine   Collection Time: 09/02/19 11:15 PM  Result Value Ref Range   Creatinine, Urine 101.87 mg/dL   Total Protein, Urine 22 mg/dL   Protein Creatinine Ratio 0.22 (H) 0.00 - 0.15 mg/mg[Cre]  CBC   Collection Time: 09/02/19 11:23 PM  Result Value Ref Range   WBC 9.7 4.0 - 10.5 K/uL   RBC 4.31 3.87 - 5.11 MIL/uL   Hemoglobin 10.7 (L) 12.0 - 15.0 g/dL   HCT 78.5 (L) 36 - 46 %   MCV 77.7 (L) 80.0 - 100.0 fL   MCH 24.8 (L) 26.0 - 34.0 pg   MCHC 31.9 30.0 - 36.0 g/dL   RDW 88.5 (H) 02.7 - 74.1 %   Platelets 130 (L) 150 - 400 K/uL   nRBC 0.0 0.0 - 0.2 %  Comprehensive metabolic panel   Collection Time: 09/02/19 11:23 PM  Result Value Ref Range   Sodium 135 135 - 145 mmol/L   Potassium 3.9 3.5 - 5.1 mmol/L   Chloride 105 98 - 111 mmol/L   CO2 22 22 - 32 mmol/L   Glucose, Bld 81 70 - 99 mg/dL   BUN 9 6 - 20 mg/dL   Creatinine, Ser 2.87 0.44 - 1.00 mg/dL   Calcium 8.6 (L) 8.9 - 10.3 mg/dL   Total Protein 5.9 (L) 6.5 - 8.1 g/dL   Albumin 3.0 (L) 3.5 - 5.0 g/dL   AST 21 15 - 41 U/L   ALT 13 0 - 44 U/L   Alkaline Phosphatase 179 (H) 38 - 126 U/L   Total Bilirubin 0.5 0.3 - 1.2 mg/dL   GFR calc non Af Amer >60 >60 mL/min   GFR calc Af Amer >60 >60 mL/min   Anion gap 8 5 - 15  Type and screen [facility]   Collection Time: 09/02/19 11:23 PM  Result Value Ref Range   ABO/RH(D) O POS    Antibody Screen NEG    Sample Expiration       09/05/2019,2359 Performed at [facility], [address] [place], [place] [address]     Patient Active Problem List   Diagnosis Date Noted   Gestational hypertension 09/03/2019   Anemia in pregnancy, unspecified trimester 06/13/2019   Carpal tunnel syndrome on both sides 06/12/2019   Supervision of other normal pregnancy, antepartum 04/08/2019    Assessment/Plan:  Karen Joseph is a 19 y.o. G2P0010 at [redacted]w[redacted]d here for induction of labor for gHTN.  1. Will administer 50 mcg misoprostol buccally  2. Will start PCN for GBS prophylaxis 3. Continue to monitor BP 4. Offer pain medication or other comfort measures as needed  5. Allow light diet for now  6. Recheck SVE in 4 hours or sooner if needed 7. Movement or rest as pt prefers   #Labor: IOL with cytotec followed by FB + pitocin #Pain: Prefers no pain medication at this time #FWB: Continuous fetal monitoring, Category 1 strip on admission #MOF: breast #MOC: undecided; considering outpatient hormonal IUD  #Circ: desires   Current problems:  1. Gestational HTN: new diagnosis on admission given multiple mild range blood pressures over a 4 hour period. Reassuringly, no signs/symptoms of preeclampsia. HELLP labs wnl except for anemia (improved from prior) and thrombocytopenia as noted below. 2. Anemia in pregnancy: H&H 10.7 and 33.5 on admission. 3. Thrombocytopenia - plts 130 on admission.  Nicholos Johns [place],  Student-MidWife  09/03/2019, 2:01 AM  Attestation of Supervision of Student:  I confirm that I have verified the information documented in the nurse midwife student's note and that I have also personally reperformed the history, physical exam and all medical decision making activities.  I have verified that all services and findings are accurately documented in this student's note; and I agree with management and plan as outlined in the documentation. I have also made any necessary editorial changes.  Sheila Oats, MD Center for [facility], [facility] 09/03/2019 2:38 AM

## 2019-09-03 NOTE — Progress Notes (Signed)
S)Karen Joseph was assessed at bedside due to continued mild bleeding and passage of blood clots.  At bedside, she noted feeling tired but no lightheadedness or dizziness.    O) BP 123/82   Pulse 85   Temp (!) 97.5 F (36.4 C) (Oral)   Resp 16   Ht 5' (1.524 m)   Wt 71.2 kg   LMP 11/24/2018   SpO2 99%   BMI 30.66 kg/m   Abdomen: fundus above the umbilicus and significantly reduced following blind sweep and removal of moderate clots from the uterus. Pelvis: uterine fundus palpated with removal of moderate clots, no palpable membranes in the lower uterine segment.  A/P) Likely continue bleeding due to atony.  Low suspicion for retained tissue due to intact placenta.  Her PMHx is significant for anemia and thrombocytopenia.  Will administer rectal cytotec and continue to monitor. -cytotec 800 mg rectal

## 2019-09-03 NOTE — Progress Notes (Signed)
Labor Progress Note Karen Joseph is a 19 y.o. G2P0010 at [redacted]w[redacted]d presented for IOL for gHTN  S: trying to avoid epidural.  Increased pain with the start of pit.  No headaches or vision changes.  O:  BP 114/72    Pulse 78    Temp 98.5 F (36.9 C) (Oral)    Resp 20    Ht 5' (1.524 m)    Wt 71.2 kg    LMP 11/24/2018    SpO2 99%    BMI 30.66 kg/m  EFM: 165/moderate var/pos accels, no decels  CVE: Dilation: 3 Effacement (%): 90 Cervical Position: Middle Station: -3 Presentation: Vertex Exam by:: Lorretta Harp RNC   A&P: 19 y.o. G2P0010 [redacted]w[redacted]d IOL for gHTN.  #Labor: Progressing well. Started Pit at 7:30.  Continue pit for now.  Will examen again in 4 hours. #Pain: IV PRN #FWB: Cat I #GBS positive PCN #gHTN: systolic pressure in the 130s and 140s. No severe range noted.  PIH labs WNL. #anemia: monitor delivery for [facility] #thrombocytopenia: monitor delivery for [facility]  [name], MD 9:28 AM

## 2019-09-03 NOTE — Progress Notes (Signed)
Labor Progress Note Sanela Evola is a 19 y.o. G2P0010 at [redacted]w[redacted]d presented for IOL for gHTN.  S: A lot of increased pelvic pressure. Increasing pain with contractions.    O:  BP 136/87    Pulse 79    Temp (!) 97.5 F (36.4 C) (Oral)    Resp 18    Ht 5' (1.524 m)    Wt 71.2 kg    LMP 11/24/2018    SpO2 99%    BMI 30.66 kg/m  EFM: 130/moderate var/early and prolonged decels with contractions.    CVE: Dilation: 10 Dilation Complete Date: 09/03/19 Dilation Complete Time: 1655 Effacement (%): 90 Cervical Position: Anterior Station: Plus 1 Presentation: Vertex Exam by:: Dr. Homero Fellers   A&P: 19 y.o. G2P0010 [redacted]w[redacted]d IOL for gHTN.  #Labor: Progressing well. Complete and started pushing.  Reduced Pit from 14 to 7 due to frequent contractions and decels.  #Pain: No epidural. #FWB: Cat II. Overall reassuring.  #GBS positive #gHTN: no severe range pressures in past 4 hours. No headaches/vision changes #anemia/thrombocytopenia: monitor bleeding at delivery.  [name], MD 5:05 PM

## 2019-09-03 NOTE — Progress Notes (Signed)
Labor Progress Note Karen Joseph is a 19 y.o. G2P0010 at [redacted]w[redacted]d presented for IOL for gHTN.  S: Increased contraction strength. Still wants to avoid epidural.  No headaches or vision changes.  O:  BP 112/64   Pulse 76   Temp 98.5 F (36.9 C) (Oral)   Resp 18   Ht 5' (1.524 m)   Wt 71.2 kg   LMP 11/24/2018   SpO2 99%   BMI 30.66 kg/m  EFM: 140/moderate var/pos accels, some difficulty monitoring baby during mom's repositioning but no true decels.   CVE: Dilation: 5 Effacement (%): 60 Cervical Position: Posterior Station: -1 Presentation: Vertex Exam by:: Dr. Homero Fellers   A&P: 19 y.o. G2P0010 [redacted]w[redacted]d IOL for gHTN.   #Labor: Progressing well. FB out. Continue pit.  Can consider AROM at next check. #Pain: trying to avoid epidural. IV PRN. #FWB: Cat I #GBS positive #gHTN: systolics in the 120-130s for the most part. No severe range. #anemia/thrombocytopenia: monitor bleeding at delivery.  [name], MD 11:51 AM

## 2019-09-03 NOTE — Discharge Summary (Addendum)
Postpartum Discharge Summary    Patient Name: Karen Joseph DOB: 05-04-2000 MRN: [id number]  Date of admission: 09/02/2019 Delivery date:09/03/2019  Delivering provider: Arrie Senate  Date of discharge: 09/05/2019  Admitting diagnosis: Gestational hypertension [O13.9] Intrauterine pregnancy: [redacted]w[redacted]d    Secondary diagnosis:  Active Problems:   Supervision of other normal pregnancy, antepartum   Anemia in pregnancy, unspecified trimester   Gestational hypertension   [place]rombocytopenia ([place]   Vaginal delivery   First degree perineal laceration   Positive GBS test  Additional problems: None    Discharge diagnosis: Term Pregnancy Delivered, Gestational Hypertension and Anemia                                              Post partum procedures:Postpartum rectal cytotec 800 mcg for continued bleeding/clot passage, resolved after medication Augmentation: Pitocin, Cytotec and IP Foley Complications: None  Hospital course: Induction of Labor With Vaginal Delivery   19y.o. yo G2P0010 at 455w3das admitted to the hospital 09/02/2019 for induction of labor.  Indication for induction: Gestational hypertension. She had neg pre-e labs and no severe range BPs. Patient had an uncomplicated labor course, but then had some increased bldg post delivery requiring cytotec. Membrane Rupture Time/Date: 2:50 PM ,09/03/2019   Delivery Method:Vaginal, Spontaneous  Episiotomy: None  Lacerations:  1st degree;Perineal  Details of delivery can be found in separate delivery note.  Patient had a routine postpartum course with nl BPs without antihypertensives. Patient is discharged home 09/05/19.  Newborn Data: Birth date:09/03/2019  Birth time:5:24 PM  Gender:Female  Living status:Living  Apgars:8 ,9  [phone]   Magnesium Sulfate received: No BMZ received: No Rhophylac:N/A MMR:N/A T-DaP:Given prenatally Flu: N/A Transfusion:No  Physical exam  Vitals:   09/03/19 1500 09/03/19 1530 09/03/19  1600 09/03/19 1720  BP: 110/67 130/85 136/87   Pulse: 100 86 79 90  Resp: 20  18   Temp:   (!) 97.5 F (36.4 C)   TempSrc:   Oral   SpO2:      Weight:      Height:       General: alert, cooperative and no distress Lochia: appropriate Uterine Fundus: firm Incision: N/A DVT Evaluation: No evidence of DVT seen on physical exam.  Labs: Lab Results  Component Value Date   WBC 9.7 09/02/2019   HGB 10.7 (L) 09/02/2019   HCT 33.5 (L) 09/02/2019   MCV 77.7 (L) 09/02/2019   PLT 130 (L) 09/02/2019   CMP Latest Ref Rng & Units 09/02/2019  Glucose 70 - 99 mg/dL 81  BUN 6 - 20 mg/dL 9  Creatinine 0.44 - 1.00 mg/dL 0.44  Sodium 135 - 145 mmol/L 135  Potassium 3.5 - 5.1 mmol/L 3.9  Chloride 98 - 111 mmol/L 105  CO2 22 - 32 mmol/L 22  Calcium 8.9 - 10.3 mg/dL 8.6(L)  Total Protein 6.5 - 8.1 g/dL 5.9(L)  Total Bilirubin 0.3 - 1.2 mg/dL 0.5  Alkaline Phos 38 - 126 U/L 179(H)  AST 15 - 41 U/L 21  ALT 0 - 44 U/L 13   Edinburgh Score: Edinburgh Postnatal Depression Scale Screening Tool 09/04/2019  I have been able to laugh and see the funny side of things. 0  I have looked forward with enjoyment to things. 0  I have blamed myself unnecessarily when things went wrong. 1  I have been  anxious or worried for no good reason. 0  I have felt scared or panicky for no good reason. 0  Things have been getting on top of me. 0  I have been so unhappy that I have had difficulty sleeping. 1  I have felt sad or miserable. 0  I have been so unhappy that I have been crying. 1  The thought of harming myself has occurred to me. 0  Edinburgh Postnatal Depression Scale Total 3     After visit meds:  Allergies as of 09/05/2019   No Known Allergies     Medication List    STOP taking these medications   Blood Pressure Monitor Automat Devi   metroNIDAZOLE 500 MG tablet Commonly known as: FLAGYL   Wrist Splint/Right Medium Misc     TAKE these medications   acetaminophen 325 MG tablet Commonly  known as: Tylenol Take 2 tablets (650 mg total) by mouth every 4 (four) hours as needed (for pain scale < 4).   albuterol 108 (90 Base) MCG/ACT inhaler Commonly known as: VENTOLIN HFA Inhale 2 puffs into the lungs every 6 (six) hours as needed for wheezing or shortness of breath.   ascorbic acid 500 MG tablet Commonly known as: VITAMIN C Take 1 tablet (500 mg total) by mouth every other day.   ibuprofen 600 MG tablet Commonly known as: ADVIL Take 1 tablet (600 mg total) by mouth every 6 (six) hours.   Iron 325 (65 Fe) MG Tabs Take 1 tablet (325 mg total) by mouth every other day.        Discharge home in stable condition Infant Feeding: Breast Infant Disposition:home with mother Discharge instruction: per After Visit Summary and Postpartum booklet. Activity: Advance as tolerated. Pelvic rest for 6 weeks.  Diet: routine diet Future Appointments: Future Appointments  Date Time Provider [place]  09/05/2019  4:10 PM Laury Deep, CNM [facility] None   Follow up Visit:  Please schedule this patient for a In person postpartum visit in 4 weeks with the following provider: Any provider. Additional Postpartum F/U:BP check 2-3 days  High risk pregnancy complicated by: HTN Delivery mode:  Vaginal, Spontaneous  Anticipated Birth Control:  IUD  Anemia: Predelivery Hgb 10.7. Asymptomatic. Continue FeSO4 321m q48h with concurrent ascorbic acid.   09/05/2019 VMaureen Ralphs MD   CNM attestation I have seen and examined this patient and agree with above documentation in the resident's note.   BFizza Joseph a 19y.o. G2P1011 s/p vag del.   Pain is well controlled.  Plan for birth control is IUD.  Method of Feeding: breast  PE:  BP 116/84 (BP Location: Right Arm)   Pulse 80   Temp 97.9 F (36.6 C) (Oral)   Resp 18   Ht 5' (1.524 m)   Wt 71.2 kg   LMP 11/24/2018   SpO2 97%   Breastfeeding Unknown   BMI 30.66 kg/m  Fundus firm  No results for input(s):  HGB, HCT in the last 72 hours.   Plan: discharge today - postpartum care discussed - f/u clinic in1 weeks for BP check, then 4-6 weeks for postpartum visit   KMyrtis Ser CNM 9:08 PM

## 2019-09-03 NOTE — Progress Notes (Signed)
Labor Progress Note Karen Joseph is a 19 y.o. G2P0010 at [redacted]w[redacted]d presenting for IOL given new diagnosis of gHTN.  S: Pt reports doing well with increasing intensity of contractions. No concern of HA, vision changes, chest pain, difficulty breathing or RUQ pain.  O:  BP 118/69   Pulse 76   Temp 98.8 F (37.1 C) (Oral)   Resp 18   Ht 5' (1.524 m)   Wt 71.2 kg   LMP 11/24/2018   SpO2 99%   BMI 30.66 kg/m  EFM: BL 140/moderate variability/+accels/no decels, Category 1 strip Contractions q2-3 min  CVE: Dilation: 2 Effacement (%): 60 Cervical Position: Anterior Station: -3 Presentation: Vertex Exam by:: Dr Barb Merino    A&P: 19 y.o. G2P0010 [redacted]w[redacted]d presenting for IOL given new diagnosis of gHTN. #Labor: Progressing well. S/p cytotec dose at 0152. FB placed. Will consider initiating pitocin in several hours to allow for therapeutic rest. #Pain: pt declines pain medications at this time. #FWB: Category 1 strip #GBS positive--currently treating with PCN #gHTN: new diagnosis on admission given multiple mild range BPs over a 4 hour period. Reassuringly, no new signs/symptoms concerning for preeclampsia. HELLP labs wnl except for anemia (improved from prior) and thrombocytopenia as noted below. Most recent BP wnl. #Anemia in pregnancy: H&H 10.7 and 33.5 on admission. #Thrombocytopenia - plts 130 on admission.  Sheila Oats, MD 5:28 AM

## 2019-09-03 NOTE — Discharge Instructions (Signed)

## 2019-09-04 NOTE — Progress Notes (Signed)
Post Partum Day 1  Subjective:  Karen Joseph is a 19 y.o. G2P1011 [redacted]w[redacted]d s/p SVD.  No acute events overnight.  Pt denies problems with ambulating, voiding or po intake.  She denies nausea or vomiting.  Pain is well controlled.  She has had flatus. She has not had bowel movement.  Lochia Moderate.  Plan for birth control is IUD.  Method of Feeding: Breast.  Objective: BP 112/70 (BP Location: Left Arm)   Pulse 96   Temp 98.8 F (37.1 C) (Oral)   Resp 16   Ht 5' (1.524 m)   Wt 71.2 kg   LMP 11/24/2018   SpO2 100%   Breastfeeding Unknown   BMI 30.66 kg/m   Physical Exam:  General: alert, cooperative and no distress Lochia:normal flow Chest: breathing comfortably on room air Abdomen: +BS, soft, nontender, fundus firm at/below umbilicus Uterine Fundus: firm, below umbilicus DVT Evaluation: No evidence of DVT seen on physical exam. Extremities: no edema  Recent Labs    09/02/19 2323  HGB 10.7*  HCT 33.5*    Assessment/Plan:  ASSESSMENT: Karen Joseph is a 19 y.o. G2P1011 [redacted]w[redacted]d ppd #1 s/p NSVD doing well.   #MOF: breast #MOC: IUD #dispo: plan to DC tomorrow #PP Bleeding: some trouble following delivery yesterday. Got rectal cytotec. Bleeding well controlled overnight and this am.   LOS: 1 day   Karen Joseph 09/04/2019, 7:49 AM

## 2019-09-04 NOTE — Lactation Note (Signed)
This note was copied from a baby's chart. Lactation Consultation Note  Patient Name: Karen Joseph AVWUJ'W Date: 09/04/2019 Reason for consult: Initial assessment;Primapara;1st time breastfeeding;Term;Infant weight loss;Other (Comment) (post circ)  Baby is 20 hours old  As LC entered the room , and mom was waking up and mentioned  Last attempt to feed was just prior to the baby going for a circ at 0910.  Baby has not fed since 6 am.  LC unwrapped baby and changed a large black to mec stool and placed baby STS on the left breast / football. Baby sluggish at 1st. LC moistened a glove finger and massaged the baby's gums and was able to get the baby sucking on her gloved finer and then latched with depth and was still feeding at 18 mins with swallows and per mom comfortable.  LC showed mom how to hand express , with glistening and mom reported positive breast changes with pregnancy.  Areolas noted to very dry and per mom has been dry the entire pregnancy.  LC recommended coconut oil , alittle tab will do .  LC recommended breast massage, hand express, reverse pressure.  And the coconut oil will soften the skin which will make it easier to hand express.  Per mom has a DEBP at home.  LC provided the [facility] pamphlet with phone numbers.   Report given to the RN [employer] .    Maternal Data Has patient been taught Hand Expression?: Yes Does the patient have breastfeeding experience prior to this delivery?: No  Feeding Feeding Type: Breast Fed  LATCH Score Latch: Grasps breast easily, tongue down, lips flanged, rhythmical sucking.  Audible Swallowing: A few with stimulation  Type of Nipple: Everted at rest and after stimulation  Comfort (Breast/Nipple): Soft / non-tender  Hold (Positioning): Assistance needed to correctly position infant at breast and maintain latch.  LATCH Score: 8  Interventions Interventions: Breast feeding basics reviewed;Assisted with latch;Skin to skin;Breast  massage;Hand express;Breast compression;Adjust position;Support pillows;Position options;Expressed milk  Lactation Tools Discussed/Used WIC Program: No   Consult Status Consult Status: Follow-up Date: 09/05/19 Follow-up type: In-patient    Matilde Sprang Jaquel Coomer 09/04/2019, 2:18 PM

## 2019-09-05 ENCOUNTER — Encounter: Payer: Medicaid Other | Admitting: Obstetrics and Gynecology

## 2019-09-05 MED ORDER — IBUPROFEN 600 MG PO TABS: 600.0000 mg | ORAL_TABLET | Freq: Four times a day (QID) | ORAL | 0 refills | Status: AC

## 2019-09-05 MED ORDER — ACETAMINOPHEN 325 MG PO TABS: 650.0000 mg | ORAL_TABLET | ORAL | Status: AC | PRN

## 2019-09-05 NOTE — Lactation Note (Signed)
This note was copied from a baby's chart. Lactation Consultation Note  Patient Name: Karen Joseph POEUM'P Date: 09/05/2019 Reason for consult: Follow-up assessment;Primapara;1st time breastfeeding;Infant weight loss;Other (Comment);Term;Nipple pain/trauma (9 % weight loss)  Baby is 84 hours old . 9 % weight loss, serum Bili - 7.5.  As LC entered the room , baby awake and hungry.  LC reviewed and updated the doc flow sheets per mom. Per mom the right nipple tender. LC assessed with moms permission and  Didn't note any breakdown only areola edema/ skin more moist today compared to yesterday.  LC recommended continuing to use her coconut oil to soften the tissue. Breast are both warm and per mom breast are fuller.  LC placed baby STS on the right breast / football and assisted mom with obtaining  depth and reviewed hand expressing. Steady flow  Of colostrum noted.  Baby latched , initially mom feeling some discomfort and improved once the depth obtained.  Baby still feeding at 14 minutes with increased swallows with breast compressions.  Sore nipple and engorgement prevention and tx reviewed . LC provided and instructed mom on coconut oil for dry areolas until the skin is less dry and then  After feedings use the comfort gels ( cold ) when warm , rinse with warm water and alternate with shells while awake.  Hand pump with #24 F and #27 F when the breast are really full.  LC stressed the importance of STS until the baby is back to birth weight , gaining steadily, and can stay awake for majority of the feeding.  LC praised mom for her breast feeding efforts.  Mom has the [facility] pamphlet with phone numbers and is aware of the [facility] Healthy[facility]aby website.    Maternal Data Has patient been taught Hand Expression?: Yes Does the patient have breastfeeding experience prior to this delivery?: No  Feeding Feeding Type: Breast Fed  LATCH Score Latch: Grasps breast easily, tongue down, lips flanged,  rhythmical sucking.  Audible Swallowing: Spontaneous and intermittent  Type of Nipple: Everted at rest and after stimulation  Comfort (Breast/Nipple): Filling, red/small blisters or bruises, mild/mod discomfort  Hold (Positioning): Assistance needed to correctly position infant at breast and maintain latch.  LATCH Score: 8  Interventions Interventions: Breast feeding basics reviewed;Assisted with latch;Skin to skin;Breast massage;Hand express;Breast compression;Adjust position;Support pillows;Expressed milk;Position options;Coconut oil;Shells;Comfort gels;Hand pump  Lactation Tools Discussed/Used Tools: Shells;Pump;Flanges;Coconut oil;Comfort gels Flange Size: 24;27 Shell Type: Inverted Breast pump type: Manual Pump Review: Setup, frequency, and cleaning;Milk Storage Initiated by:: MAI Date initiated:: 09/05/19   Consult Status Consult Status: Complete Date: 09/05/19 Follow-up type: In-patient    [name] 09/05/2019, 12:05 PM

## 2019-09-10 ENCOUNTER — Telehealth: Payer: Self-pay | Admitting: General Practice

## 2019-09-10 NOTE — Telephone Encounter (Signed)
Left message on VM for patient to give our office a call to schedule appointment for BP check.

## 2019-09-10 NOTE — Telephone Encounter (Signed)
-----   Message from Alric Seton, MD sent at 09/03/2019  6:00 PM EDT ----- Regarding: postpartum appt Follow up Visit:   Please schedule this patient for a In person postpartum visit in 4 weeks with the following provider: Any provider. Additional Postpartum F/U:BP check 2-3 days  High risk pregnancy complicated by: HTN Delivery mode:  Vaginal, Spontaneous  Anticipated Birth Control:  IUD

## 2019-09-11 ENCOUNTER — Inpatient Hospital Stay (HOSPITAL_COMMUNITY)
Admission: AD | Admit: 2019-09-11 | Discharge: 2019-09-11 | Disposition: A | Discharge status: Home / Self Care | Payer: PRIVATE HEALTH INSURANCE | Attending: Obstetrics & Gynecology | Admitting: Obstetrics & Gynecology

## 2019-09-11 ENCOUNTER — Encounter (HOSPITAL_COMMUNITY): Payer: Self-pay | Admitting: Obstetrics & Gynecology

## 2019-09-11 ENCOUNTER — Other Ambulatory Visit: Payer: Self-pay

## 2019-09-11 DIAGNOSIS — J45909 Unspecified asthma, uncomplicated: Secondary | ICD-10-CM | POA: Diagnosis not present

## 2019-09-11 DIAGNOSIS — R509 Fever, unspecified: Secondary | ICD-10-CM

## 2019-09-11 DIAGNOSIS — O9953 Diseases of the respiratory system complicating the puerperium: Secondary | ICD-10-CM | POA: Diagnosis not present

## 2019-09-11 DIAGNOSIS — Z20822 Contact with and (suspected) exposure to covid-19: Secondary | ICD-10-CM | POA: Insufficient documentation

## 2019-09-11 DIAGNOSIS — Z79899 Other long term (current) drug therapy: Secondary | ICD-10-CM | POA: Diagnosis not present

## 2019-09-11 DIAGNOSIS — O165 Unspecified maternal hypertension, complicating the puerperium: Secondary | ICD-10-CM

## 2019-09-11 DIAGNOSIS — O8612 Endometritis following delivery: Secondary | ICD-10-CM

## 2019-09-11 DIAGNOSIS — N719 Inflammatory disease of uterus, unspecified: Secondary | ICD-10-CM

## 2019-09-11 LAB — COMPREHENSIVE METABOLIC PANEL
ALT: 26 U/L (ref 0–44)
AST: 22 U/L (ref 15–41)
Albumin: 3.2 g/dL — ABNORMAL LOW (ref 3.5–5.0)
Alkaline Phosphatase: 143 U/L — ABNORMAL HIGH (ref 38–126)
Anion gap: 11 (ref 5–15)
BUN: 5 mg/dL — ABNORMAL LOW (ref 6–20)
CO2: 21 mmol/L — ABNORMAL LOW (ref 22–32)
Calcium: 8.6 mg/dL — ABNORMAL LOW (ref 8.9–10.3)
Chloride: 105 mmol/L (ref 98–111)
Creatinine, Ser: 0.58 mg/dL (ref 0.44–1.00)
GFR calc Af Amer: >60 mL/min (ref >60)
GFR calc non Af Amer: >60 mL/min (ref >60)
Glucose, Bld: 85 mg/dL (ref 70–99)
Potassium: 3.4 mmol/L — ABNORMAL LOW (ref 3.5–5.1)
Sodium: 137 mmol/L (ref 135–145)
Total Bilirubin: 0.7 mg/dL (ref 0.3–1.2)
Total Protein: 6.5 g/dL (ref 6.5–8.1)

## 2019-09-11 LAB — URINALYSIS, ROUTINE W REFLEX MICROSCOPIC
Bilirubin Urine: NEGATIVE
Glucose, UA: NEGATIVE mg/dL
Ketones, ur: NEGATIVE mg/dL
Nitrite: NEGATIVE
Protein, ur: NEGATIVE mg/dL
RBC / HPF: >50 RBC/hpf — ABNORMAL HIGH (ref 0–5)
Specific Gravity, Urine: 1.011 (ref 1.005–1.030)
WBC, UA: >50 WBC/hpf — ABNORMAL HIGH (ref 0–5)
pH: 8 (ref 5.0–8.0)

## 2019-09-11 LAB — CBC WITH DIFFERENTIAL/PLATELET
Abs Immature Granulocytes: 0.05 10*3/uL (ref 0.00–0.07)
Basophils Absolute: 0 10*3/uL (ref 0.0–0.1)
Basophils Relative: 0 %
Eosinophils Absolute: 0 10*3/uL (ref 0.0–0.5)
Eosinophils Relative: 0 %
HCT: 34.5 % — ABNORMAL LOW (ref 36.0–46.0)
Hemoglobin: 11 g/dL — ABNORMAL LOW (ref 12.0–15.0)
Immature Granulocytes: 0 %
Lymphocytes Relative: 6 %
Lymphs Abs: 0.8 10*3/uL (ref 0.7–4.0)
MCH: 25.2 pg — ABNORMAL LOW (ref 26.0–34.0)
MCHC: 31.9 g/dL (ref 30.0–36.0)
MCV: 79.1 fL — ABNORMAL LOW (ref 80.0–100.0)
Monocytes Absolute: 0.7 10*3/uL (ref 0.1–1.0)
Monocytes Relative: 5 %
Neutro Abs: 11.4 10*3/uL — ABNORMAL HIGH (ref 1.7–7.7)
Neutrophils Relative %: 89 %
Platelets: 319 10*3/uL (ref 150–400)
RBC: 4.36 MIL/uL (ref 3.87–5.11)
RDW: 18.3 % — ABNORMAL HIGH (ref 11.5–15.5)
WBC: 13 10*3/uL — ABNORMAL HIGH (ref 4.0–10.5)
nRBC: 0 % (ref 0.0–0.2)

## 2019-09-11 LAB — SARS CORONAVIRUS 2 BY RT PCR (HOSPITAL ORDER, PERFORMED IN ~~LOC~~ HOSPITAL LAB): SARS Coronavirus 2: NEGATIVE

## 2019-09-11 MED ORDER — NIFEDIPINE ER OSMOTIC RELEASE 30 MG PO TB24: 30.0000 mg | ORAL_TABLET | Freq: Every day | ORAL | 1 refills | Status: DC

## 2019-09-11 MED ORDER — AMOXICILLIN-POT CLAVULANATE 875-125 MG PO TABS
1.0000 | ORAL_TABLET | Freq: Once | ORAL | Status: AC
  Administered 2019-09-11: 1 via ORAL
  Filled 2019-09-11: qty 1

## 2019-09-11 MED ORDER — AMOXICILLIN-POT CLAVULANATE 875-125 MG PO TABS: 1.0000 | ORAL_TABLET | Freq: Two times a day (BID) | ORAL | 0 refills | Status: AC

## 2019-09-11 MED ORDER — NIFEDIPINE ER OSMOTIC RELEASE 30 MG PO TB24
30.0000 mg | ORAL_TABLET | Freq: Once | ORAL | Status: AC
  Administered 2019-09-11: 30 mg via ORAL
  Filled 2019-09-11: qty 1

## 2019-09-11 MED ORDER — ACETAMINOPHEN 500 MG PO TABS
1000.0000 mg | ORAL_TABLET | Freq: Once | ORAL | Status: AC
  Administered 2019-09-11: 1000 mg via ORAL
  Filled 2019-09-11: qty 2

## 2019-09-11 NOTE — MAU Note (Signed)
Presents with c/o elevated BP when taken @ home, states BP 155/90.  Endorses current H/A.  Denies visual disturbances and epigastric pain.  Also states she's had a fever, 101.0.  Hasn't taken any meds. S/P NVD 09/03/2019.

## 2019-09-11 NOTE — MAU Provider Note (Signed)
History     CSN: [id number]  Arrival date and time: 09/11/19 1707   None     Chief Complaint  Patient presents with  . Headache  . Fever  . BP Evaluation   HPI  Ms.Karen Joseph is a 19 y.o.female. Status post vaginal delivery on 8/24. Her labor was induced d/t GHTN. She went home without BP medications. Her BP at the time of DC was normal.  She reports elevated BP readings at home 5 days ago, and they remained high every day this week. The highest reading she had at home was 155/90. She reports a HA that started this morning; she currently rates her HA pain 7/10. She has not taken any medication today for the headache. Has had people in and out of her home since delivery, she has not gotten the covid vaccine. The fever began today. She reports her fever 102.1. She is breast feeding, and reports left breast pain. She denies breast redness.   OB History    Gravida  2   Para  1   Term  1   Preterm      AB  1   Living  1     SAB      TAB      Ectopic      Multiple  0   Live Births  1           Past Medical History:  Diagnosis Date  . Anemia   . Asthma   . Chlamydia   . Eczema     Past Surgical History:  Procedure Laterality Date  . NO PAST SURGERIES      Family History  Problem Relation Age of Onset  . Hypertension Mother   . Diabetes Mother   . Heart disease Mother   . ALS Father   . Asthma Father     Social History   Tobacco Use  . Smoking status: Never Smoker  . Smokeless tobacco: Never Used  Vaping Use  . Vaping Use: Never used  Substance Use Topics  . Alcohol use: Not Currently    Comment: occ  . Drug use: Never    Allergies: No Known Allergies  Medications Prior to Admission  Medication Sig Dispense Refill Last Dose  . acetaminophen (TYLENOL) 325 MG tablet Take 2 tablets (650 mg total) by mouth every 4 (four) hours as needed (for pain scale < 4).   Past Week at Unknown time  . albuterol (VENTOLIN HFA) 108 (90 Base) MCG/ACT  inhaler Inhale 2 puffs into the lungs every 6 (six) hours as needed for wheezing or shortness of breath. 8 g 2 Past Month at Unknown time  . ascorbic acid (VITAMIN C) 500 MG tablet Take 1 tablet (500 mg total) by mouth every other day. 30 tablet 2 Past Week at Unknown time  . Ferrous Sulfate (IRON) 325 (65 Fe) MG TABS Take 1 tablet (325 mg total) by mouth every other day. 30 tablet 2 Past Week at Unknown time  . ibuprofen (ADVIL) 600 MG tablet Take 1 tablet (600 mg total) by mouth every 6 (six) hours. 30 tablet 0 Past Week at Unknown time   Results for orders placed or performed during the hospital encounter of 09/11/19 (from the past 48 hour(s))  SARS Coronavirus 2 by RT PCR (hospital order, performed in [facility] hospital lab) Nasopharyngeal Nasopharyngeal Swab     Status: None   Collection Time: 09/11/19  5:46 PM   Specimen: Nasopharyngeal Swab  Result Value Ref Range   SARS Coronavirus 2 NEGATIVE NEGATIVE    Comment: (NOTE) SARS-CoV-2 target nucleic acids are NOT DETECTED.  The SARS-CoV-2 RNA is generally detectable in upper and lower respiratory specimens during the acute phase of infection. The lowest concentration of SARS-CoV-2 viral copies this assay can detect is 250 copies / mL. A negative result does not preclude SARS-CoV-2 infection and should not be used as the sole basis for treatment or other patient management decisions.  A negative result may occur with improper specimen collection / handling, submission of specimen other than nasopharyngeal swab, presence of viral mutation(s) within the areas targeted by this assay, and inadequate number of viral copies (<250 copies / mL). A negative result must be combined with clinical observations, patient history, and epidemiological information.  Fact Sheet for Patients:   BoilerBrush.com.cy  Fact Sheet for Healthcare Providers: https://pope.com/  This test is not yet approved or   cleared by the [place] FDA and has been authorized for detection and/or diagnosis of SARS-CoV-2 by FDA under an Emergency Use Authorization (EUA).  This EUA will remain in effect (meaning this test can be used) for the duration of the COVID-19 declaration under Section 564(b)(1) of the Act, 21 U.S.C. section 360bbb-3(b)(1), unless the authorization is terminated or revoked sooner.  Performed at [facility], [address], [place], [place] [address]   Comprehensive metabolic panel     Status: Abnormal   Collection Time: 09/11/19  7:24 PM  Result Value Ref Range   Sodium 137 135 - 145 mmol/L   Potassium 3.4 (L) 3.5 - 5.1 mmol/L   Chloride 105 98 - 111 mmol/L   CO2 21 (L) 22 - 32 mmol/L   Glucose, Bld 85 70 - 99 mg/dL    Comment: Glucose reference range applies only to samples taken after fasting for at least 8 hours.   BUN 5 (L) 6 - 20 mg/dL   Creatinine, Ser 9.38 0.44 - 1.00 mg/dL   Calcium 8.6 (L) 8.9 - 10.3 mg/dL   Total Protein 6.5 6.5 - 8.1 g/dL   Albumin 3.2 (L) 3.5 - 5.0 g/dL   AST 22 15 - 41 U/L   ALT 26 0 - 44 U/L   Alkaline Phosphatase 143 (H) 38 - 126 U/L   Total Bilirubin 0.7 0.3 - 1.2 mg/dL   GFR calc non Af Amer >60 >60 mL/min   GFR calc Af Amer >60 >60 mL/min   Anion gap 11 5 - 15    Comment: Performed at [facility], [address], [place], [place] [address]  CBC with Differential/Platelet     Status: Abnormal   Collection Time: 09/11/19  7:24 PM  Result Value Ref Range   WBC 13.0 (H) 4.0 - 10.5 K/uL   RBC 4.36 3.87 - 5.11 MIL/uL   Hemoglobin 11.0 (L) 12.0 - 15.0 g/dL   HCT 37.1 (L) 36 - 46 %   MCV 79.1 (L) 80.0 - 100.0 fL   MCH 25.2 (L) 26.0 - 34.0 pg   MCHC 31.9 30.0 - 36.0 g/dL   RDW 69.6 (H) 78.9 - 38.1 %   Platelets 319 150 - 400 K/uL   nRBC 0.0 0.0 - 0.2 %   Neutrophils Relative % 89 %   Neutro Abs 11.4 (H) 1.7 - 7.7 K/uL   Lymphocytes Relative 6 %   Lymphs Abs 0.8 0.7 - 4.0 K/uL   Monocytes Relative 5 %   Monocytes Absolute  0.7  0 - 1 K/uL   Eosinophils Relative 0 %   Eosinophils Absolute 0.0 0 - 0 K/uL   Basophils Relative 0 %   Basophils Absolute 0.0 0 - 0 K/uL   Immature Granulocytes 0 %   Abs Immature Granulocytes 0.05 0.00 - 0.07 K/uL    Comment: Performed at [facility], [address], [place], [place] [address]  Urinalysis, Routine w reflex microscopic Urine, Clean Catch     Status: Abnormal   Collection Time: 09/11/19  7:39 PM  Result Value Ref Range   Color, Urine YELLOW YELLOW   APPearance HAZY (A) CLEAR   Specific Gravity, Urine 1.011 1.005 - 1.030   pH 8.0 5.0 - 8.0   Glucose, UA NEGATIVE NEGATIVE mg/dL   Hgb urine dipstick LARGE (A) NEGATIVE   Bilirubin Urine NEGATIVE NEGATIVE   Ketones, ur NEGATIVE NEGATIVE mg/dL   Protein, ur NEGATIVE NEGATIVE mg/dL   Nitrite NEGATIVE NEGATIVE   Leukocytes,Ua LARGE (A) NEGATIVE   RBC / HPF >50 (H) 0 - 5 RBC/hpf   WBC, UA >50 (H) 0 - 5 WBC/hpf   Bacteria, UA FEW (A) NONE SEEN   Squamous Epithelial / LPF 0-5 0 - 5   Mucus PRESENT     Comment: Performed at [facility], [address], [place], [place] [address]    Review of Systems  Constitutional: Positive for chills and fever.  Eyes: Positive for photophobia and visual disturbance.  Respiratory: Negative for shortness of breath.   Cardiovascular: Negative for chest pain.  Gastrointestinal: Positive for abdominal pain (Mild Left lower quad pain 4/10).  Genitourinary: Negative for dysuria.  Neurological: Positive for headaches.   Physical Exam   Blood pressure (!) 151/100, pulse (!) 106, temperature (!) 102.6 F (39.2 C), temperature source Oral, resp. rate 20, height 5' (1.524 m), weight 63.1 kg, SpO2 98 %, unknown if currently breastfeeding.  Patient Vitals for the past 24 hrs:  BP Temp Temp src Pulse Resp SpO2 Height Weight  09/11/19 2030 128/88 -- -- (!) 114 -- -- -- --  09/11/19 2015 (!) 137/95 -- -- (!) 109 -- -- -- --  09/11/19 2000 (!) 138/95 -- -- (!) 104 -- -- -- --   09/11/19 1954 -- 100.3 F (37.9 C) Oral -- -- -- -- --  09/11/19 1945 131/89 -- -- 96 -- -- -- --  09/11/19 1936 (!) 134/94 -- -- 99 -- -- -- --  09/11/19 1915 (!) 138/97 -- -- (!) 104 -- -- -- --  09/11/19 1900 (!) 138/95 -- -- (!) 101 -- -- -- --  09/11/19 1845 (!) 143/96 -- -- (!) 104 -- -- -- --  09/11/19 1815 (!) 151/100 -- -- (!) 106 -- -- -- --  09/11/19 1800 (!) 151/93 -- -- 95 -- -- -- --  09/11/19 1734 -- -- -- -- -- -- -- 63.1 kg  09/11/19 1726 (!) 135/92 (!) 102.6 F (39.2 C) Oral (!) 114 20 98 % 5' (1.524 m) --    Physical Exam Exam conducted with a chaperone present.  HENT:     Head: Normocephalic.  Eyes:     Extraocular Movements: Extraocular movements intact.     Pupils: Pupils are equal, round, and reactive to light.  Chest:     Chest wall: Tenderness present.     Breasts:        Right: Tenderness (left breast without erythema ) present.   Abdominal:     Tenderness: There is abdominal  tenderness in the left lower quadrant. There is no rebound.  Genitourinary:    Exam position: Supine.     Uterus: Enlarged and tender.   Neurological:     General: No focal deficit present.     Mental Status: She is alert and oriented to person, place, and time.     GCS: GCS eye subscore is 4. GCS verbal subscore is 5. GCS motor subscore is 6.     Motor: Motor function is intact.     Deep Tendon Reflexes: Reflexes are normal and symmetric. Reflexes normal.     Comments: No clonus   Psychiatric:        Mood and Affect: Mood normal.    MAU Course  Procedures  MDM  HA now 4/10 from 7/10 Denies scotoma PRE labs WNL Urine culture sent  Will treat for endometritis today and PP hypertension. BP's not in severe range.  HA improved with tylenol; with some lingering HA likely 2/2 to fever. Discussed with Dr. Despina HiddenEure. Augmentin first dose given in MAU. Procardia 30xl given in MAU.   Assessment and Plan   A:  1. Endometritis   2. Hypertension in pregnancy, postpartum  condition   3. Fever and chills     P:  Discharge home with strict return precautions Preeclampsia precautions BP check on Friday, [facility] Return to [facility] if symptoms worsen Rx: Augmentin x14 days, procardia XL  Venia Carbonasch, Heylee Tant I, NP 09/12/2019 1:13 PM

## 2019-09-11 NOTE — Discharge Instructions (Signed)
Endometritis  Endometritis is irritation, soreness, or inflammation that affects the lining of the uterus (endometrium). Infection is usually the cause of endometritis. It is important to get treatment to prevent complications. Common complications may include more severe infections and not being able to have children(infertility). What are the causes? This condition may be caused by:  Bacterial infections.  STIs (sexually transmitted infections).  A miscarriage or childbirth, especially after a long labor or cesarean delivery.  Certain gynecological procedures. These may include dilation and curettage (D&C), hysteroscopy, or birth control (contraceptive) insertion.  Tuberculosis (TB). What are the signs or symptoms? Symptoms of this condition include:  Fever.  Lower abdomen (abdominal) pain.  Pelvis (pelvic) pain.  Abnormal vaginal discharge or bleeding.  Abdominal bloating (distention) or swelling.  General discomfort or generally feeling ill.  Discomfort with bowel movements.  Constipation. How is this diagnosed? This condition may be diagnosed based on:  A physical exam, including a pelvic exam.  Tests, such as: ? Blood tests. ? Removal of a sample of endometrial tissue for testing (endometrial biopsy). ? Examining a sample of vaginal discharge under a microscope (wet prep). ? Removal of a sample of fluid from the cervix for testing (cervical culture). ? Surgical examination of the pelvis and abdomen. How is this treated? This condition is treated with:  Antibiotic medicines.  For more severe cases, hospitalization may be needed to give fluids and antibiotics directly into a vein through an IV tube. Follow these instructions at home:  Take over-the-counter and prescription medicines only as told by your health care provider.  Drink enough fluid to keep your urine clear or pale yellow.  Take your antibiotic medicine as told by your health care provider. Do  not stop taking the antibiotic even if you start to feel better.  Do not douche or have sex (including vaginal, oral, and anal sex) until your health care provider approves.  If your endometritis was caused by an STI, do not have sex (including vaginal, oral, and anal sex) until your partner has also been treated for the STI.  Return to your normal activities as told by your health care provider. Ask your health care provider what activities are safe for you.  Keep all follow-up visits as told by your health care provider. This is important. Contact a health care provider if:  You have pain that does not get better with medicine.  You have a fever.  You have pain with bowel movements. Get help right away if:  You have abdominal swelling.  You have abdominal pain that gets worse.  You have bad-smelling vaginal discharge, or an increased amount of vaginal discharge.  You have abnormal vaginal bleeding.  You have nausea and vomiting. Summary  Endometritis affects the lining of the uterus (endometrium) and is usually caused by an infection.  It is important to get treatment to prevent complications.  You have several treatment options for endometritis. Treatment may include antibiotics and IV fluids.  Take your antibiotic medicine as told by your health care provider. Do not stop taking the antibiotic even if you start to feel better.  Do not douche or have sex (including vaginal, oral, and anal sex) until your health care provider approves. This information is not intended to replace advice given to you by your health care provider. Make sure you discuss any questions you have with your health care provider. Document Revised: 12/09/2016 Document Reviewed: 01/12/2016 Elsevier Patient Education  2020 [employer] Inc.  

## 2019-09-12 ENCOUNTER — Telehealth: Payer: Self-pay | Admitting: General Practice

## 2019-09-12 LAB — CULTURE, OB URINE

## 2019-09-12 NOTE — Telephone Encounter (Signed)
Patient aware of appt scheduled for tomorrow, 09/13/2019 at 11:20am for BP check and pt has verbalized understanding.

## 2019-09-12 NOTE — Telephone Encounter (Signed)
-----   Message from Clovis Pu, RN sent at 09/12/2019 10:17 AM EDT ----- Regarding: FW: MAU follow up needed Please schedule bp check ----- Message ----- From: Duane Lope, NP Sent: 09/12/2019   8:48 AM EDT To: Clovis Pu, RN Subject: MAU follow up needed                           Please call her and schedule a BP check tomorrow.  Thank you!    Victorino Dike, NP

## 2019-09-13 ENCOUNTER — Ambulatory Visit (INDEPENDENT_AMBULATORY_CARE_PROVIDER_SITE_OTHER): Payer: Medicaid Other | Admitting: *Deleted

## 2019-09-13 ENCOUNTER — Other Ambulatory Visit: Payer: Self-pay

## 2019-09-13 VITALS — BP 130/91 | HR 83 | Temp 98.1°F | Ht 60.0 in | Wt 136.8 lb

## 2019-09-13 DIAGNOSIS — O165 Unspecified maternal hypertension, complicating the puerperium: Secondary | ICD-10-CM

## 2019-09-13 NOTE — Progress Notes (Signed)
   Subjective:  Karen Joseph is a 19 y.o. female here for BP check. Patient reported she has not picked up medication from the pharmacy. She will pick up med today.  Hypertension ROS: not taking medications regularly as instructed, no TIA's, no chest pain on exertion, no dyspnea on exertion, no swelling of ankles, no orthostatic dizziness or lightheadedness, no orthopnea or paroxysmal nocturnal dyspnea, no palpitations and no intermittent claudication symptoms.    Objective:  BP (!) 130/91 (BP Location: Left Arm, Patient Position: Sitting, Cuff Size: Normal)   Pulse 83   Temp 98.1 F (36.7 C) (Oral)   Ht 5' (1.524 m)   Wt 136 lb 12.8 oz (62.1 kg)   BMI 26.72 kg/m   Appearance alert, well appearing, and in no distress, oriented to person, place, and time and normal appearing weight. General exam BP noted to be stable today in office.    Assessment:   Blood Pressure stable and asymptomatic.   Plan:  Follow up: 4 days and as needed.  Patient to pick up Procardia from pharmacy today.  Clovis Pu, RN

## 2019-09-17 ENCOUNTER — Ambulatory Visit: Payer: Medicaid Other

## 2019-10-10 ENCOUNTER — Ambulatory Visit: Payer: Medicaid Other | Admitting: Medical

## 2019-11-06 ENCOUNTER — Encounter: Payer: Self-pay | Admitting: General Practice

## 2019-11-06 ENCOUNTER — Other Ambulatory Visit (HOSPITAL_COMMUNITY)
Admission: RE | Admit: 2019-11-06 | Discharge: 2019-11-06 | Disposition: A | Discharge status: Home / Self Care | Payer: PRIVATE HEALTH INSURANCE | Source: Ambulatory Visit | Attending: Medical | Admitting: Medical

## 2019-11-06 ENCOUNTER — Ambulatory Visit (INDEPENDENT_AMBULATORY_CARE_PROVIDER_SITE_OTHER): Payer: PRIVATE HEALTH INSURANCE | Admitting: Certified Nurse Midwife

## 2019-11-06 ENCOUNTER — Other Ambulatory Visit: Payer: Self-pay

## 2019-11-06 ENCOUNTER — Encounter: Payer: Self-pay | Admitting: Certified Nurse Midwife

## 2019-11-06 DIAGNOSIS — Z01812 Encounter for preprocedural laboratory examination: Secondary | ICD-10-CM

## 2019-11-06 DIAGNOSIS — N760 Acute vaginitis: Secondary | ICD-10-CM | POA: Diagnosis not present

## 2019-11-06 DIAGNOSIS — Z113 Encounter for screening for infections with a predominantly sexual mode of transmission: Secondary | ICD-10-CM | POA: Diagnosis not present

## 2019-11-06 DIAGNOSIS — Z30430 Encounter for insertion of intrauterine contraceptive device: Secondary | ICD-10-CM | POA: Diagnosis not present

## 2019-11-06 DIAGNOSIS — O165 Unspecified maternal hypertension, complicating the puerperium: Secondary | ICD-10-CM | POA: Diagnosis not present

## 2019-11-06 DIAGNOSIS — B9689 Other specified bacterial agents as the cause of diseases classified elsewhere: Secondary | ICD-10-CM

## 2019-11-06 LAB — POCT URINE PREGNANCY: Preg Test, Ur: NEGATIVE

## 2019-11-06 MED ORDER — LEVONORGESTREL 19.5 MCG/DAY IU IUD
INTRAUTERINE_SYSTEM | Freq: Once | INTRAUTERINE | Status: AC
  Administered 2019-11-06: 52 mg via INTRAUTERINE

## 2019-11-06 MED ORDER — NIFEDIPINE ER OSMOTIC RELEASE 30 MG PO TB24: 30.0000 mg | ORAL_TABLET | Freq: Every day | ORAL | 0 refills | Status: AC

## 2019-11-06 NOTE — Progress Notes (Signed)
Post Partum Visit Note  Karen Joseph is a 19 y.o. [id number] female who presents for a postpartum visit. She is 8 weeks postpartum following a normal spontaneous vaginal delivery.  I have fully reviewed the prenatal and intrapartum course. The delivery was at 40.3 gestational weeks.  Anesthesia: none. Postpartum course has been uncomplicated. Baby is doing well. Baby is feeding by both breast and bottle - Similac Advance. Bleeding moderate lochia (return of menses). Bowel function is normal. Bladder function is normal. Patient was sexually active once two weeks ago but none since. Contraception method is none, desires IUD today. Postpartum depression screening: positive: score 5.   The pregnancy intention screening data noted above was reviewed. Potential methods of contraception were discussed. The patient elected to proceed with IUD or IUS.      The following portions of the patient's history were reviewed and updated as appropriate: allergies, current medications, past family history, past medical history, past social history, past surgical history and problem list.  Review of Systems Pertinent items noted in HPI and remainder of comprehensive ROS otherwise negative.    Objective:  Blood pressure (!) 153/91, pulse 87, temperature 97.9 F (36.6 C), temperature source Oral, height 5' (1.524 m), weight 133 lb (60.3 kg), last menstrual period 10/29/2019, currently breastfeeding.  General:  alert, cooperative and no distress   Breasts:  negative  Lungs: clear to auscultation bilaterally  Heart:  regular rate and rhythm  Abdomen: soft, non-tender; bowel sounds normal; no masses,  no organomegaly   Vulva:  normal  Vagina: normal vagina, no discharge, exudate, lesion, or erythema and normal menstrual bleeding  Cervix:  multiparous appearance, no cervical motion tenderness and no lesions  Corpus: normal size, contour, position, consistency, mobility, non-tender  Adnexa:  normal adnexa    Rectal Exam: Not performed.   IUD Insertion Procedure Note Patient identified, informed consent performed, consent signed.   Discussed risks of irregular bleeding, cramping, infection, malpositioning or misplacement of the IUD outside the uterus which may require further procedure such as laparoscopy. Also discussed >99% contraception efficacy, increased risk of ectopic pregnancy with failure of method.   Emphasized that this did not protect against STIs, condoms recommended during all sexual encounters. Time out was performed.  Urine pregnancy test negative.  Speculum placed in the vagina.  Cervix visualized.  Cleaned with Betadine x 2.  Grasped anteriorly with a single tooth tenaculum.  Uterus sounded to 5cm.  Liletta IUD placed per manufacturer's recommendations.  Strings trimmed to 3 cm. Tenaculum was removed, good hemostasis noted.  Patient tolerated procedure well.   Patient was given post-procedure instructions.  She was advised to have backup contraception for one week.  Patient was also asked to check IUD strings periodically and follow up in 4 weeks for IUD check.  Assessment:  Postpartum examination following vaginal delivery - Plan: POCT urine pregnancy  Encounter for IUD insertion - Plan: POCT urine pregnancy, levonorgestrel (LILETTA) 19.5 MCG/DAY IUD  Screen for STD (sexually transmitted disease) - Plan: Cervicovaginal ancillary only( [place])  Plan:   Essential components of care per ACOG recommendations:  1.  Mood and well being: Patient with negative depression screening today. Reviewed local resources for support.  - Patient does not use tobacco.  - hx of drug use? No    2. Infant care and feeding:  -Patient currently breastmilk feeding? Yes  -Social determinants of health (SDOH) reviewed in EPIC. No concerns  3. Sexuality, contraception and birth spacing - Patient does not  want a pregnancy in the next year.  Desired family size is currently unknown, wants more  children but not for several more years.  - Reviewed forms of contraception in tiered fashion. Patient desired IUD today.   - Discussed birth spacing of 18 months  4. Sleep and fatigue -Encouraged family/partner/community support of 4 hrs of uninterrupted sleep to help with mood and fatigue  5. Physical Recovery  - Discussed patients delivery and complications - Patient had a 1st degree laceration, perineal healing reviewed. Patient expressed understanding - Patient has urinary incontinence? No - Patient is safe to resume physical and sexual activity  6.  Health Maintenance - Last pap smear done 04/25/19 and was normal with negative HPV - Mammogram not indicated  7. Chronic Disease - PCP follow up for hypertension, restarted on procardia XL - Referral to [facility] - [facility]  Edd Arbour, CNM, MSN, [facility] 11/06/19 4:18 PM

## 2019-11-07 LAB — CERVICOVAGINAL ANCILLARY ONLY
Bacterial Vaginitis (gardnerella): POSITIVE — AB
Candida Glabrata: NEGATIVE
Candida Vaginitis: NEGATIVE
Chlamydia: NEGATIVE
Comment: NEGATIVE
Comment: NEGATIVE
Comment: NEGATIVE
Comment: NEGATIVE
Comment: NEGATIVE
Comment: NORMAL
Neisseria Gonorrhea: NEGATIVE
Trichomonas: NEGATIVE

## 2019-11-07 MED ORDER — METRONIDAZOLE 500 MG PO TABS: 500.0000 mg | ORAL_TABLET | Freq: Two times a day (BID) | ORAL | 0 refills | Status: AC

## 2019-11-07 NOTE — Addendum Note (Signed)
Addended by: Edd Arbour on: 11/07/2019 07:23 PM   Modules accepted: Orders

## 2019-11-27 ENCOUNTER — Ambulatory Visit: Payer: Medicaid Other | Admitting: Certified Nurse Midwife

## 2019-12-18 ENCOUNTER — Ambulatory Visit (INDEPENDENT_AMBULATORY_CARE_PROVIDER_SITE_OTHER): Payer: Medicaid Other | Admitting: Primary Care

## 2020-01-20 ENCOUNTER — Encounter: Payer: Medicaid Other | Admitting: Family

## 2020-01-20 NOTE — Progress Notes (Signed)
Patient did not show for appointment.   

## 2021-08-08 IMAGING — US US OB < 14 WEEKS - US OB TV
1 series · 15 of 28 positions shown · non-contrast
Comparison: None.

CLINICAL DATA: Epigastric pain

EXAM:
OBSTETRIC <14 WK US AND TRANSVAGINAL OB US
TECHNIQUE: Both transabdominal and transvaginal ultrasound examinations were
performed for complete evaluation of the gestation as well as the
maternal uterus, adnexal regions, and pelvic cul-de-sac.
Transvaginal technique was performed to assess early pregnancy.

[Series 1: us ob < 14 weeks - us ob tv · 97 acquisitions, 15 frames shown]
[im 1/97]
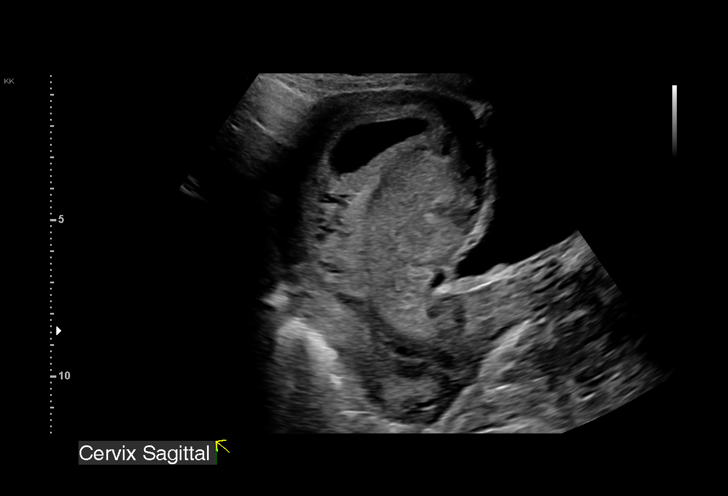
[im 8/97]
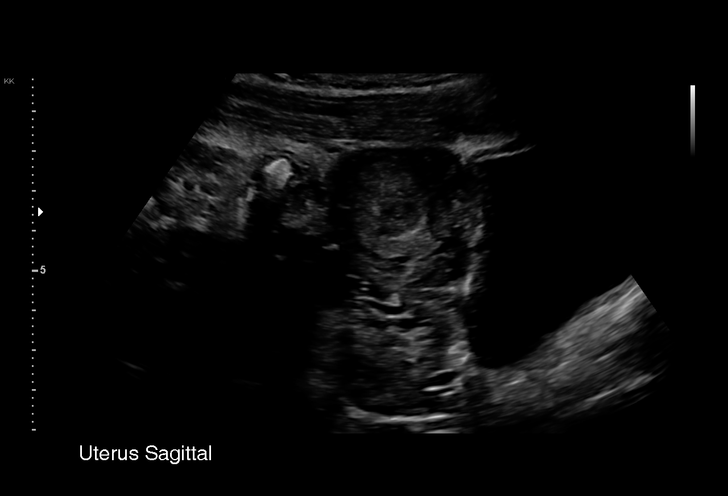
[im 15/97]
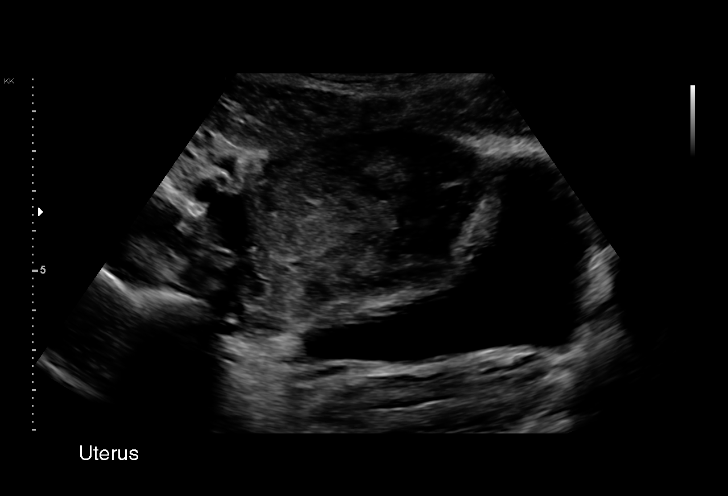
[im 22/97]
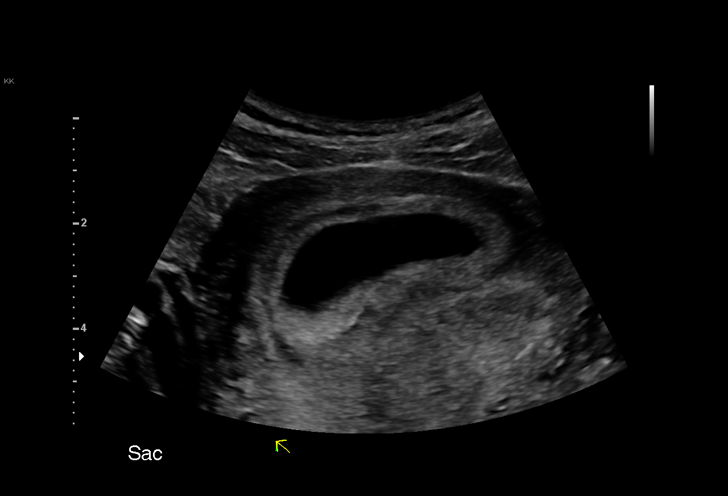
[im 29/97]
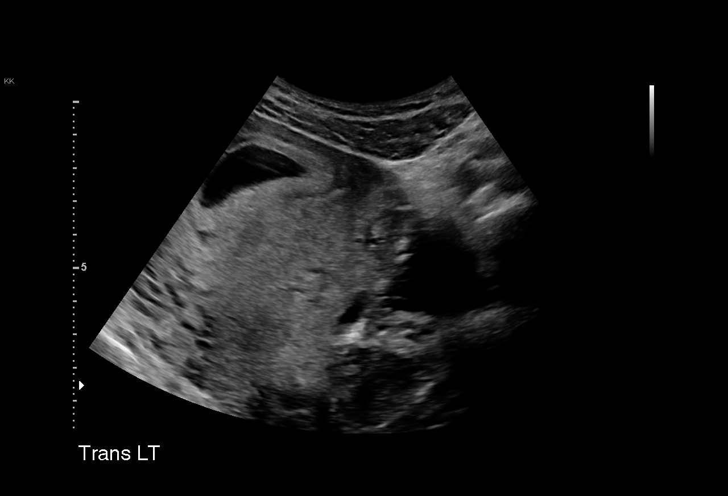
[im 36/97]
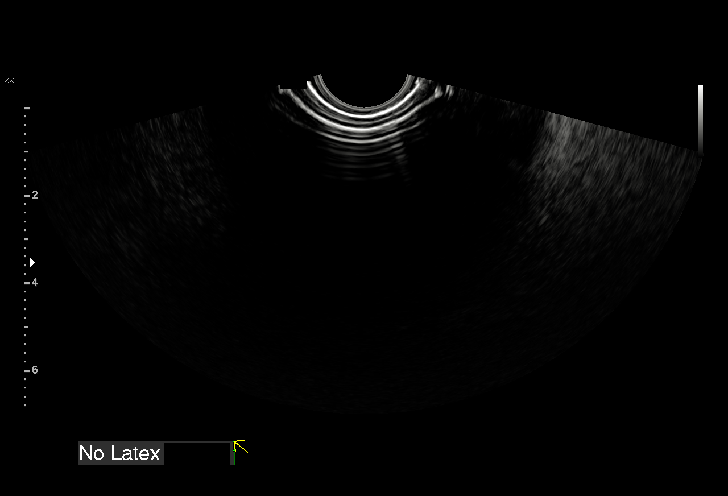
[im 43/97]
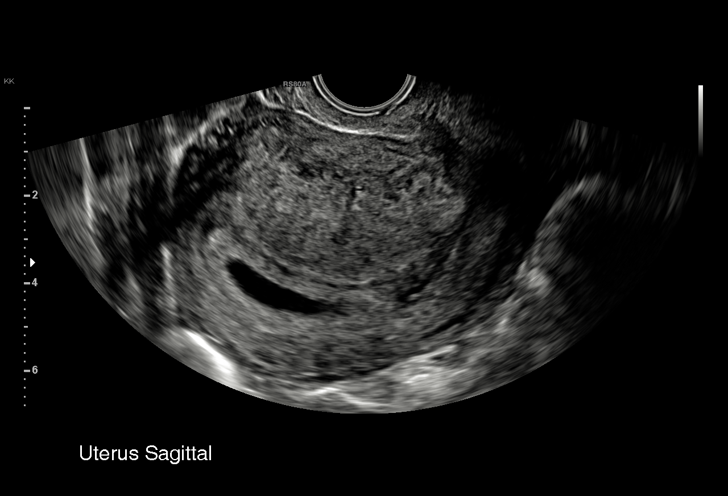
[im 50/97]
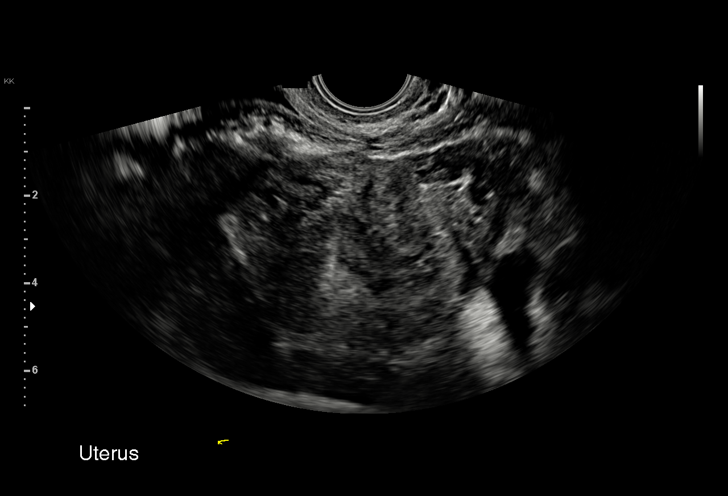
[im 54/97]
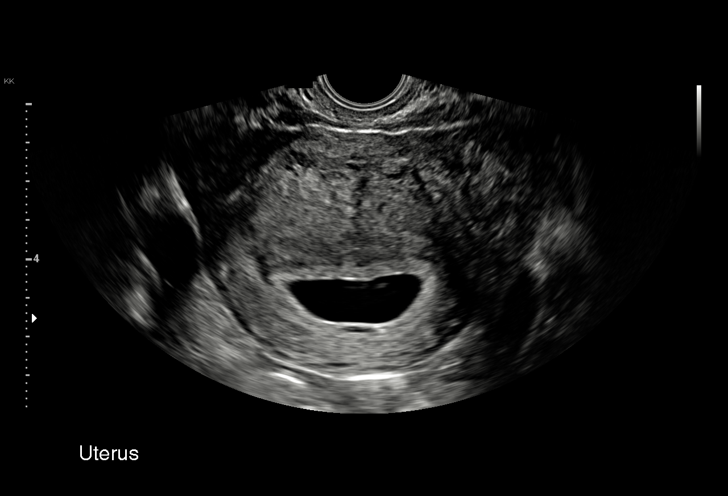
[im 61/97]
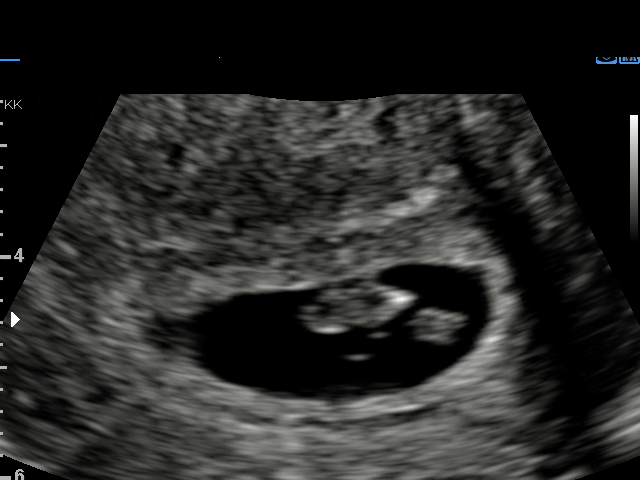
[im 68/97]
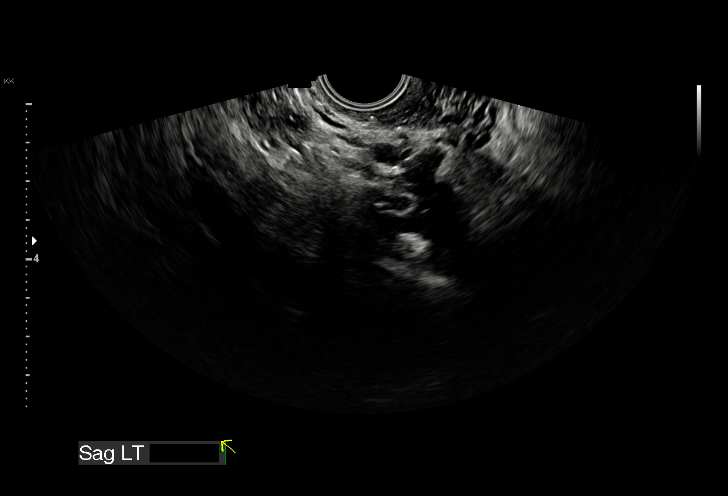
[im 75/97]
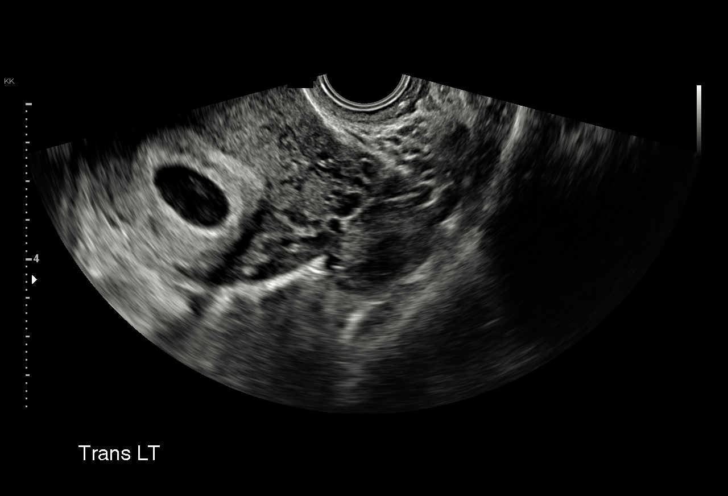
[im 82/97]
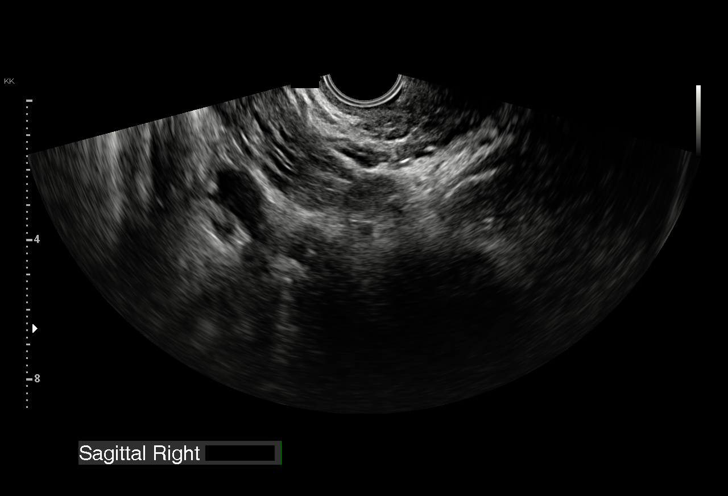
[im 89/97]
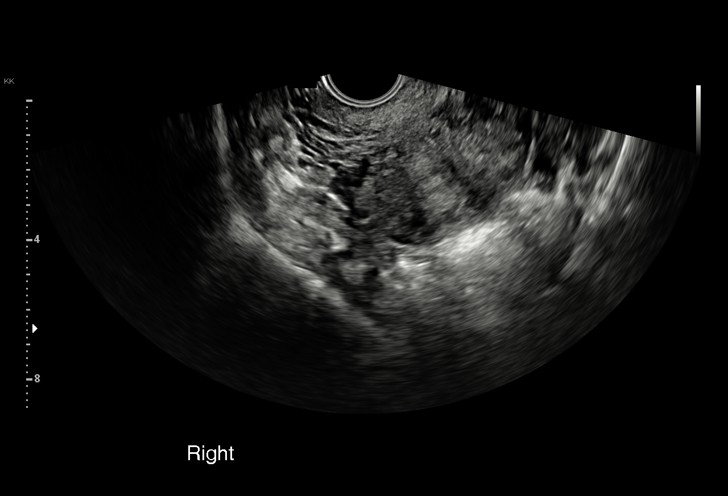
[im 97/97]
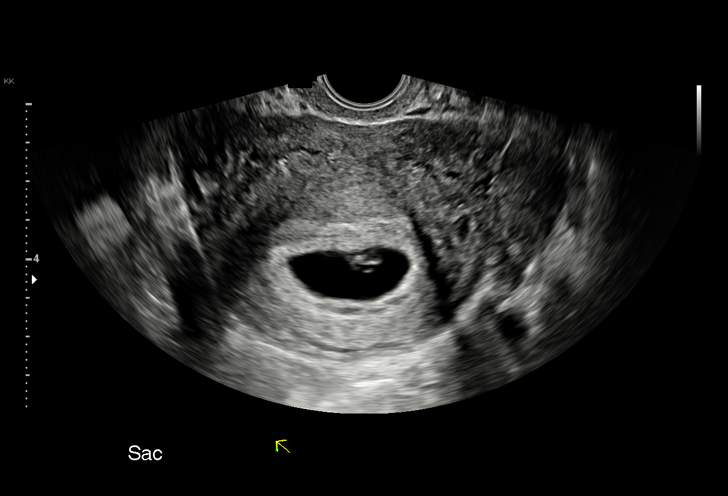

[15 of 28 positions shown; findings below may reference images not displayed]

FINDINGS: Intrauterine gestational sac: Single

Yolk sac:  Visualized.

Embryo:  Visualized.

Cardiac Activity: Visualized.

Heart Rate: 146 bpm

CRL: 10 mm   7 w   0 d                  US EDC: 09/04/2019

Subchorionic hemorrhage:  None visualized.

Maternal uterus/adnexae: There is a probable corpus luteal cyst
involving the left ovary. There is no maternal abnormality. There is
a trace amount of free fluid in the patient's pelvis.
IMPRESSION: There is a single live IUP at 7 weeks and 0 days as detailed above.

## 2021-12-11 IMAGING — US US MFM OB COMP +14 WKS
1 series · 13 of 28 positions shown · non-contrast
Comparison: none

[Series 1: us mfm ob comp +14 wks · 75 acquisitions, 13 frames shown]
[im 3/75]
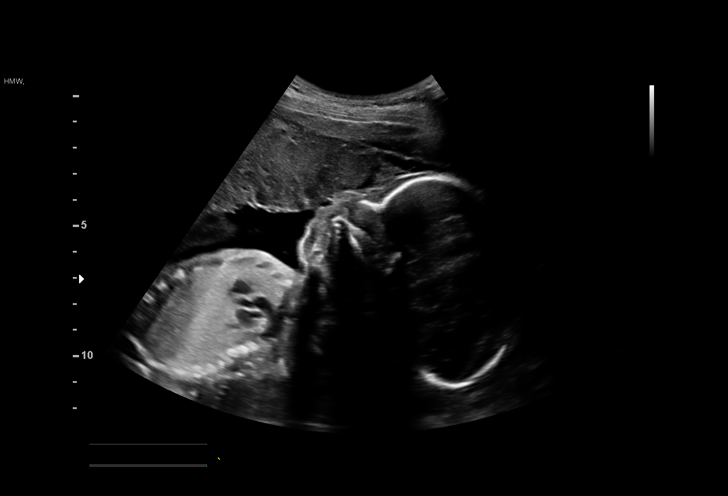
[im 9/75]
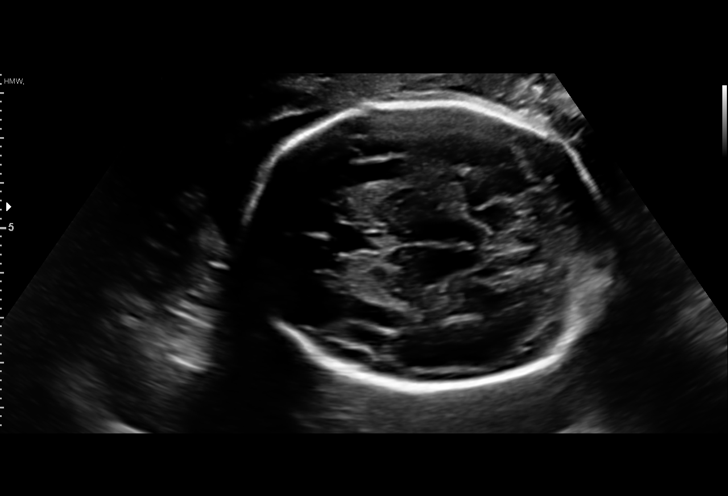
[im 14/75]
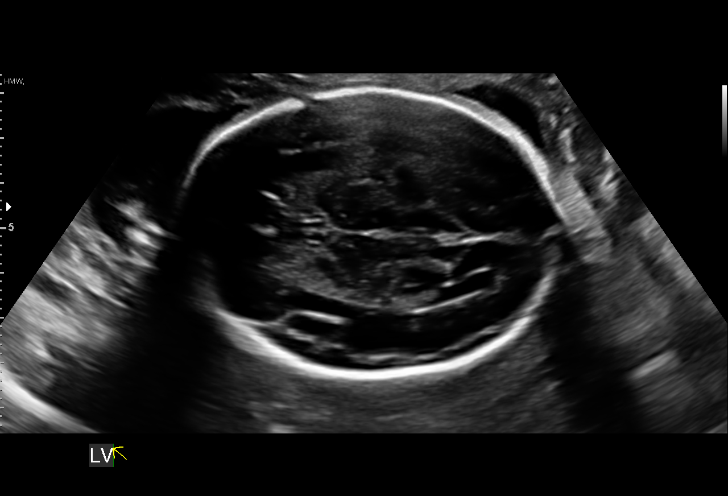
[im 20/75]
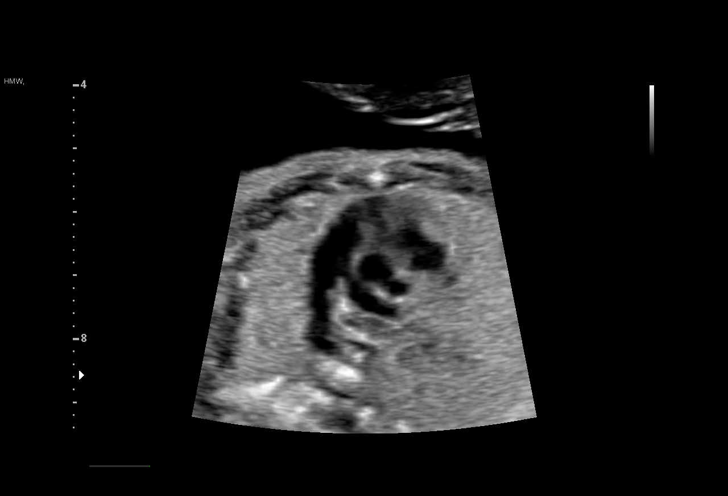
[im 25/75]
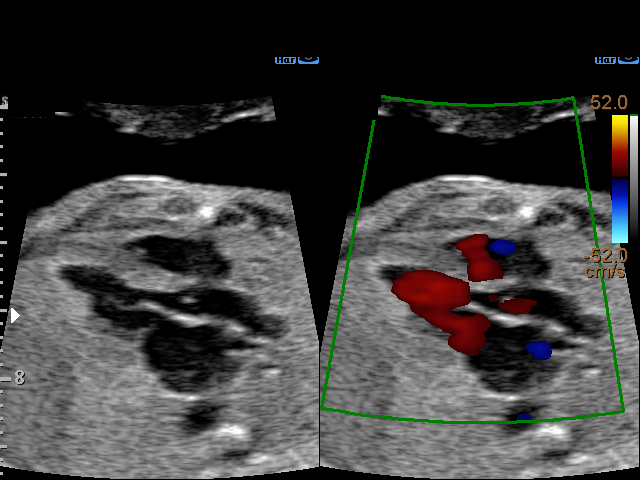
[im 31/75]
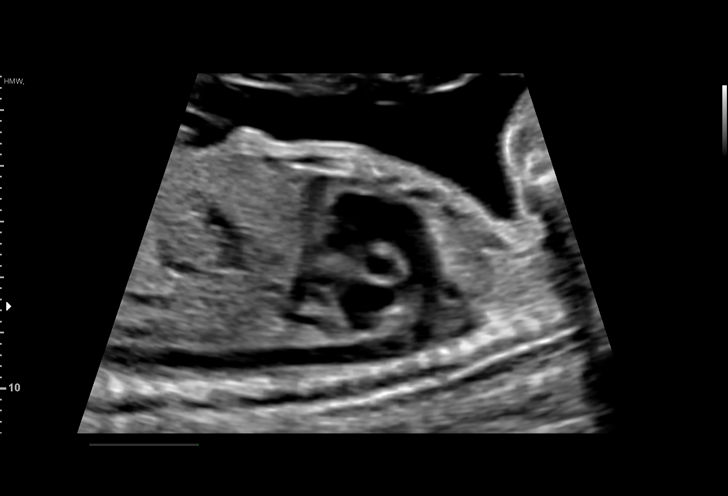
[im 39/75]
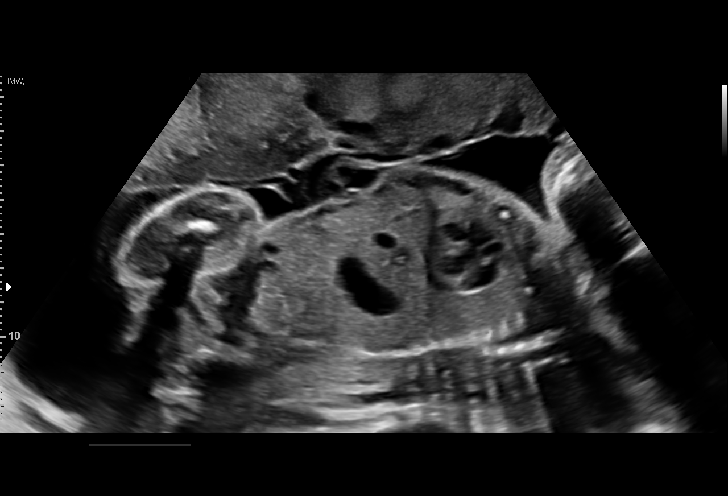
[im 44/75]
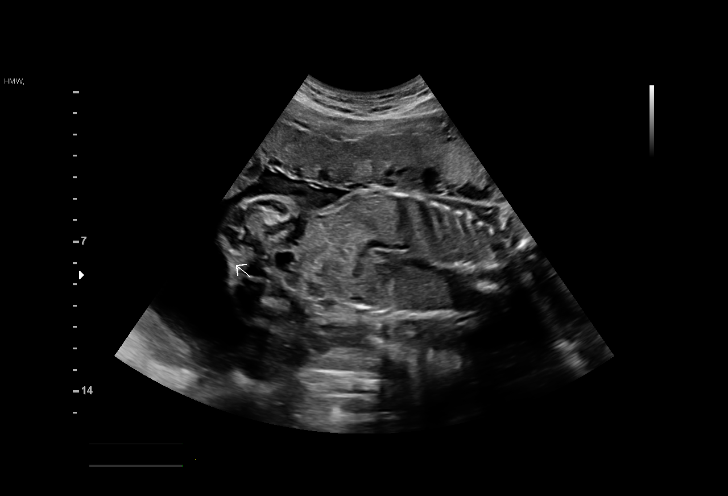
[im 50/75]
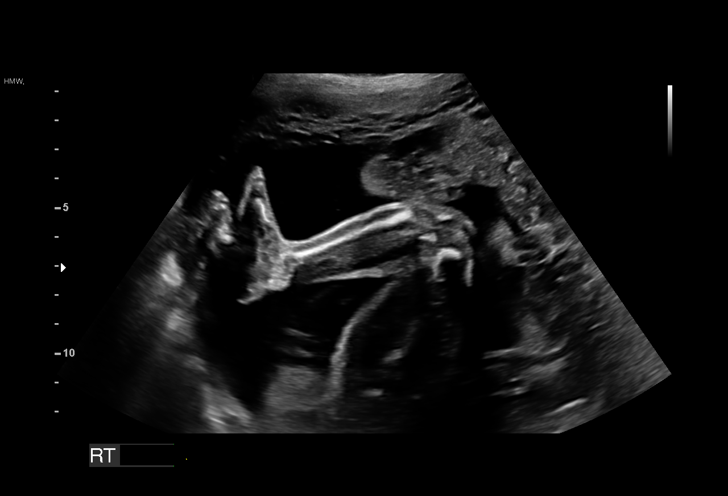
[im 55/75]
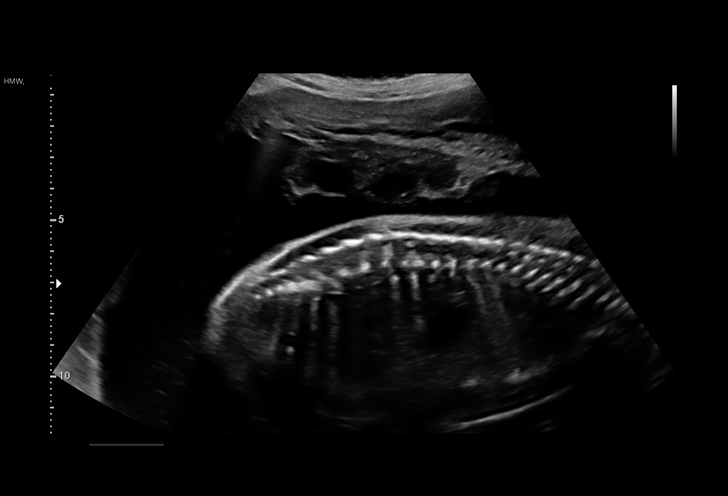
[im 61/75]
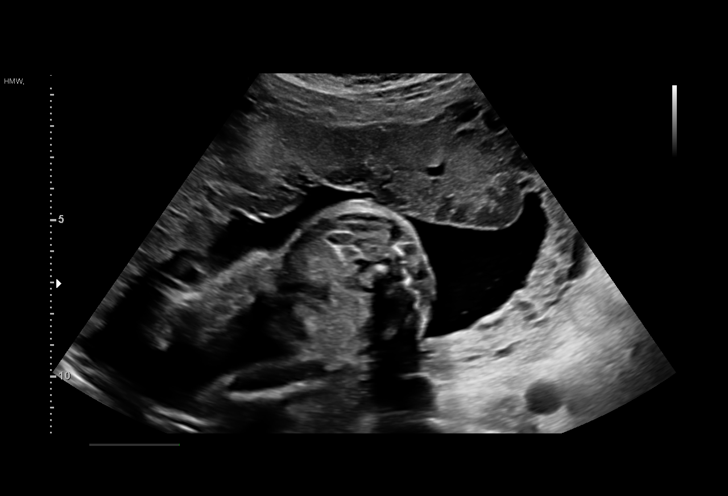
[im 66/75]
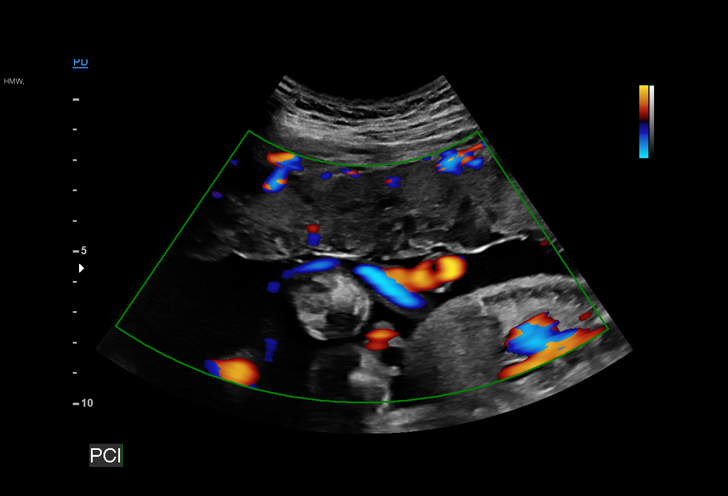
[im 72/75]
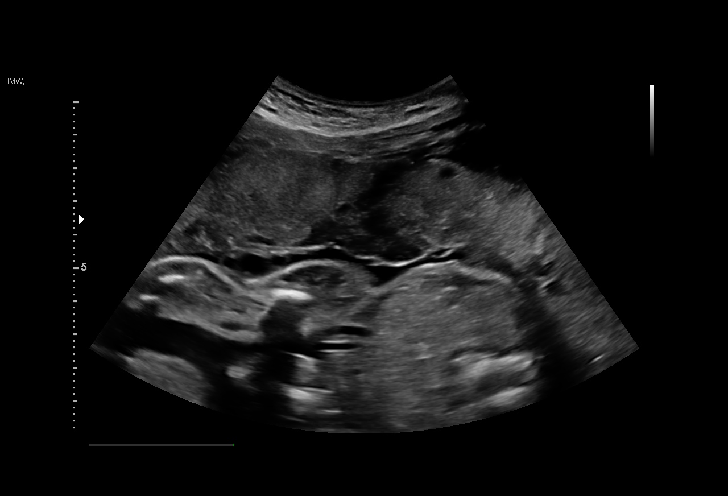

[13 of 28 positions shown; findings below may reference images not displayed]

1  US MFM OB COMP + 14 WK                76805.01    SHRINE MAACARON

Indications

 Encounter for antenatal screening for
 malformations
 25 weeks gestation of pregnancy
 Low risk NIPS, U.VZZ, AFP neg
 Genetic carrier (silent [place])
Fetal Evaluation

 Num Of Fetuses:         1
 Fetal Heart Rate(bpm):  145
 Cardiac Activity:       Observed
 Presentation:           Cephalic
 Placenta:               Anterior
 P. Cord Insertion:      Visualized, central

 Amniotic Fluid
 AFI FV:      Within normal limits

                             Largest Pocket(cm)

Biometry

 BPD:      63.1  mm     G. Age:  25w 4d         46  %    CI:        71.87   %    70 - 86
                                                         FL/HC:      20.5   %    18.7 -
 HC:      236.9  mm     G. Age:  25w 5d         38  %    HC/AC:      1.14        1.04 -
 AC:       207   mm     G. Age:  25w 2d         36  %    FL/BPD:     76.9   %    71 - 87
 FL:       48.5  mm     G. Age:  26w 2d         63  %    FL/AC:      23.4   %    20 - 24
 HUM:      43.3  mm     G. Age:  25w 6d         55  %
 CER:      29.2  mm     G. Age:  26w 1d         60  %
 CM:        4.2  mm

 Est. FW:     841  gm    1 lb 14 oz      51  %
OB History
 Gravidity:    2         Term:   0        Prem:   0        SAB:   1
 TOP:          0       Ectopic:  0        Living: 0
Gestational Age

 LMP:           25w 3d        Date:  11/24/18                 EDD:   08/31/19
 U/S Today:     25w 5d                                        EDD:   08/29/19
 Best:          25w 3d     Det. By:  LMP  (11/24/18)          EDD:   08/31/19
Anatomy

 Cranium:               Appears normal         LVOT:                   Appears normal
 Cavum:                 Appears normal         Aortic Arch:            Appears normal
 Ventricles:            Appears normal         Ductal Arch:            Appears normal
 Choroid Plexus:        Appears normal         Diaphragm:              Appears normal
 Cerebellum:            Appears normal         Stomach:                Appears normal, left
                                                                       sided
 Posterior Fossa:       Appears normal         Abdomen:                Appears normal
 Nuchal Fold:           Not applicable (>20    Abdominal Wall:         Appears nml (cord
                        wks GA)                                        insert, abd wall)
 Face:                  Appears normal         Cord Vessels:           Appears normal (3
                        (orbits and profile)                           vessel cord)
 Lips:                  Appears normal         Kidneys:                Appear normal
 Palate:                Appears normal         Bladder:                Appears normal
 Thoracic:              Appears normal         Spine:                  Appears normal
 Heart:                 Appears normal         Upper Extremities:      Appears normal
                        (4CH, axis, and
                        situs)
 RVOT:                  Appears normal         Lower Extremities:      Appears normal

 Other:  Heels visualized. Nasal bone visualized. Technically difficult due to
         fetal position.
Cervix Uterus Adnexa

 Cervix
 Not visualized (advanced GA >25wks)

 Uterus
 No abnormality visualized.

 Right Ovary
 No adnexal mass visualized.

 Left Ovary
 No adnexal mass visualized.

 Cul De Sac
 No free fluid seen.

 Adnexa
 No abnormality visualized.
Comments

 This patient was seen for a detailed fetal anatomy scan.
 She denies any significant past medical history and denies
 any problems in her current pregnancy.
 She had a cell free DNA test earlier in her pregnancy which
 indicated a low risk for trisomy 21, 18, and 13.
 The fetal growth and amniotic fluid level were appropriate for
 her gestational age.
 There were no obvious fetal anomalies noted on today's
 ultrasound exam.
 Anomalies may be missed due to technical limitations. If the
 fetus is in a suboptimal position or maternal habitus is
 increased, visualization of the fetus in the maternal uterus
 may be impaired.
 Follow up as indicated.
